# Patient Record
Sex: Female | Born: 1985 | Race: Black or African American | Hispanic: No | Marital: Single | Smoking: Never smoker
Health system: Southern US, Community
[De-identification: ages and names within clinical notes are randomized; demographics above are authoritative.]

## PROBLEM LIST (undated history)

## (undated) ENCOUNTER — Inpatient Hospital Stay: Payer: Self-pay

## (undated) DIAGNOSIS — F419 Anxiety disorder, unspecified: Secondary | ICD-10-CM

## (undated) DIAGNOSIS — F32A Depression, unspecified: Secondary | ICD-10-CM

## (undated) DIAGNOSIS — Z853 Personal history of malignant neoplasm of breast: Secondary | ICD-10-CM

## (undated) DIAGNOSIS — O039 Complete or unspecified spontaneous abortion without complication: Secondary | ICD-10-CM

## (undated) DIAGNOSIS — F4310 Post-traumatic stress disorder, unspecified: Secondary | ICD-10-CM

## (undated) DIAGNOSIS — F319 Bipolar disorder, unspecified: Secondary | ICD-10-CM

## (undated) HISTORY — DX: Anxiety disorder, unspecified: F41.9

## (undated) HISTORY — DX: Post-traumatic stress disorder, unspecified: F43.10

## (undated) HISTORY — PX: OTHER SURGICAL HISTORY: SHX169

## (undated) HISTORY — DX: Depression, unspecified: F32.A

## (undated) HISTORY — DX: Personal history of malignant neoplasm of breast: Z85.3

---
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
## 2006-06-12 ENCOUNTER — Observation Stay: Payer: Self-pay | Admitting: Certified Nurse Midwife

## 2006-07-02 ENCOUNTER — Observation Stay: Payer: Self-pay

## 2006-07-22 ENCOUNTER — Observation Stay: Payer: Self-pay | Admitting: Obstetrics and Gynecology

## 2006-08-13 ENCOUNTER — Observation Stay: Payer: Self-pay | Admitting: Certified Nurse Midwife

## 2006-08-16 ENCOUNTER — Inpatient Hospital Stay: Payer: Self-pay | Admitting: Obstetrics and Gynecology

## 2007-03-16 ENCOUNTER — Emergency Department: Payer: Self-pay | Admitting: Emergency Medicine

## 2007-10-20 ENCOUNTER — Emergency Department: Payer: Self-pay | Admitting: Emergency Medicine

## 2008-09-16 ENCOUNTER — Emergency Department: Payer: Self-pay | Admitting: Emergency Medicine

## 2008-12-22 ENCOUNTER — Emergency Department: Payer: Self-pay | Admitting: Unknown Physician Specialty

## 2009-06-14 ENCOUNTER — Emergency Department: Payer: Self-pay | Admitting: Emergency Medicine

## 2009-09-01 ENCOUNTER — Ambulatory Visit: Payer: Self-pay | Admitting: Advanced Practice Midwife

## 2009-09-07 ENCOUNTER — Encounter: Payer: Self-pay | Admitting: Obstetrics and Gynecology

## 2009-10-08 ENCOUNTER — Observation Stay: Payer: Self-pay | Admitting: Obstetrics and Gynecology

## 2009-10-19 ENCOUNTER — Observation Stay: Payer: Self-pay | Admitting: Obstetrics and Gynecology

## 2009-11-11 ENCOUNTER — Observation Stay: Payer: Self-pay

## 2009-11-20 ENCOUNTER — Observation Stay: Payer: Self-pay

## 2010-12-14 IMAGING — US US OB DETAIL+14 WK - NRPT [facility]
1 series · 14 of 28 positions shown · non-contrast
Comparison: none

[Series 1: us ob detail+14 wk - nrpt [facility] · 14 of 89 slices shown]
[im 4/89]
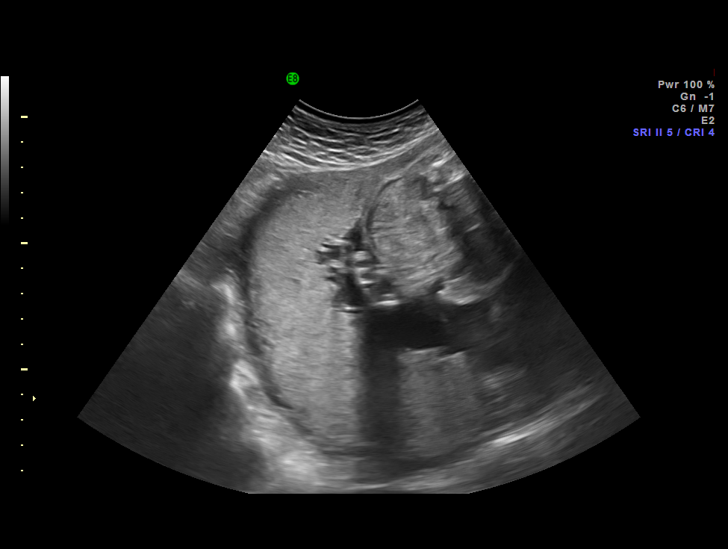
[im 10/89]
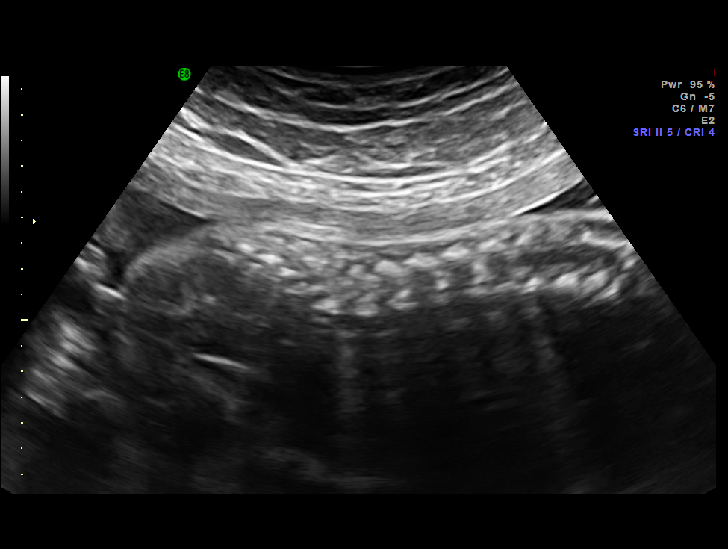
[im 17/89]
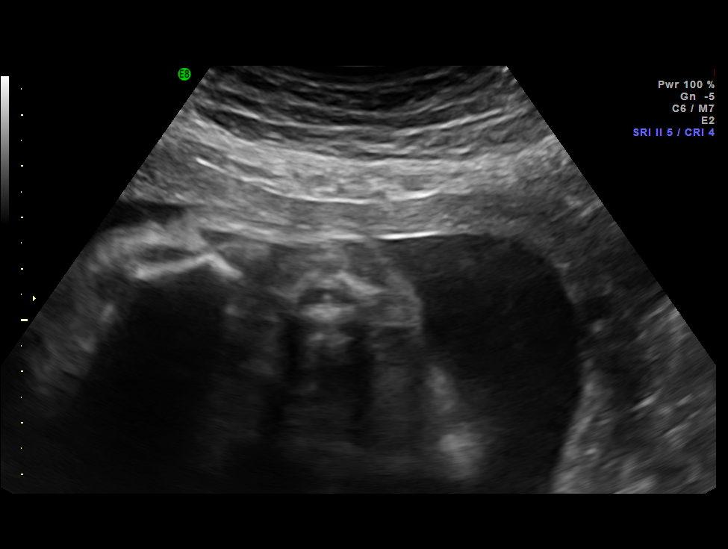
[im 23/89]
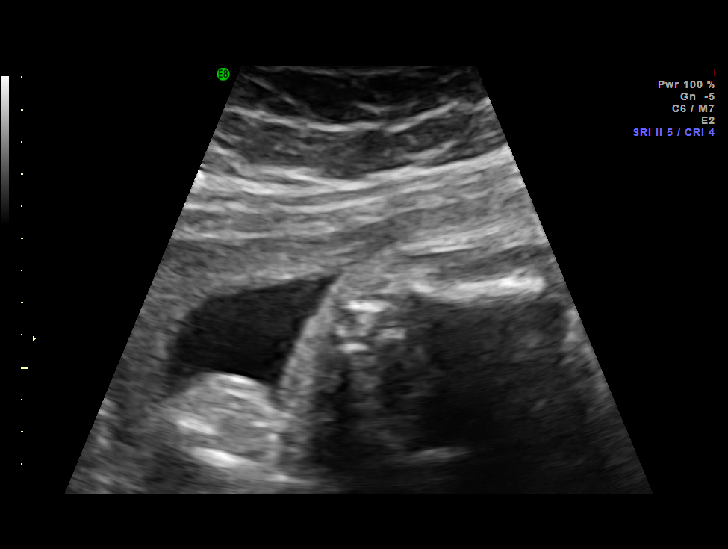
[im 30/89]
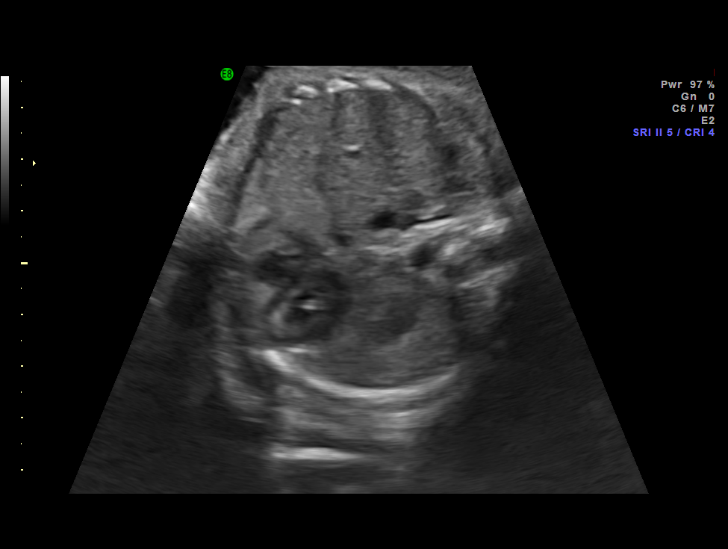
[im 36/89]
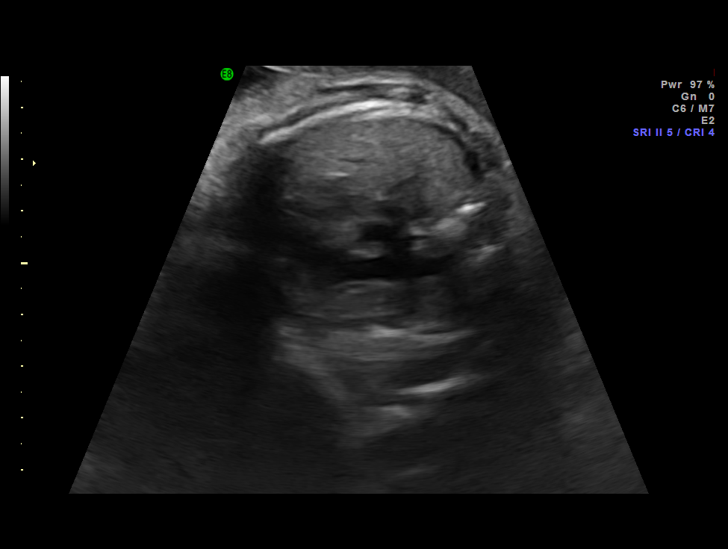
[im 43/89]
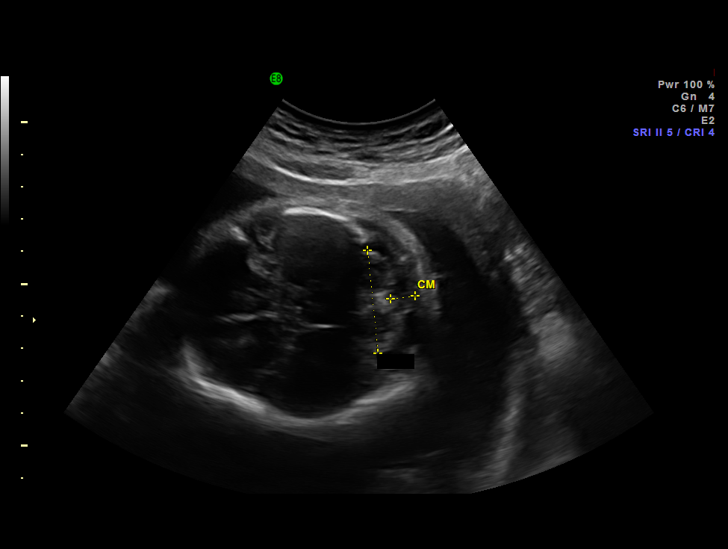
[im 49/89]
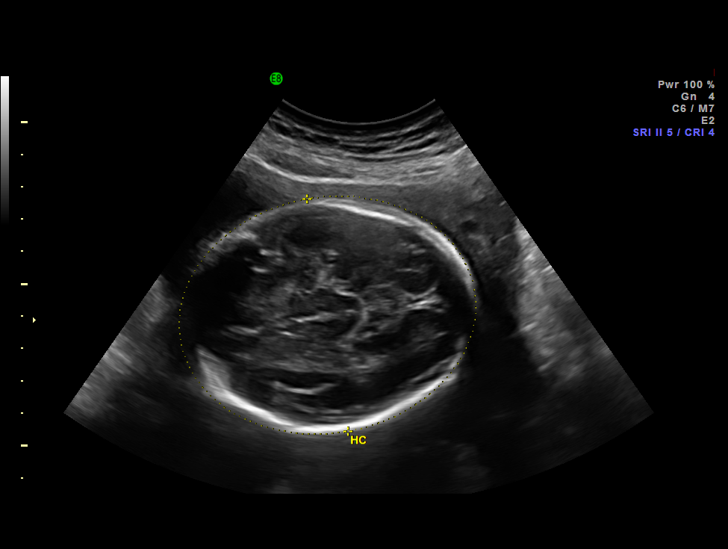
[im 56/89]
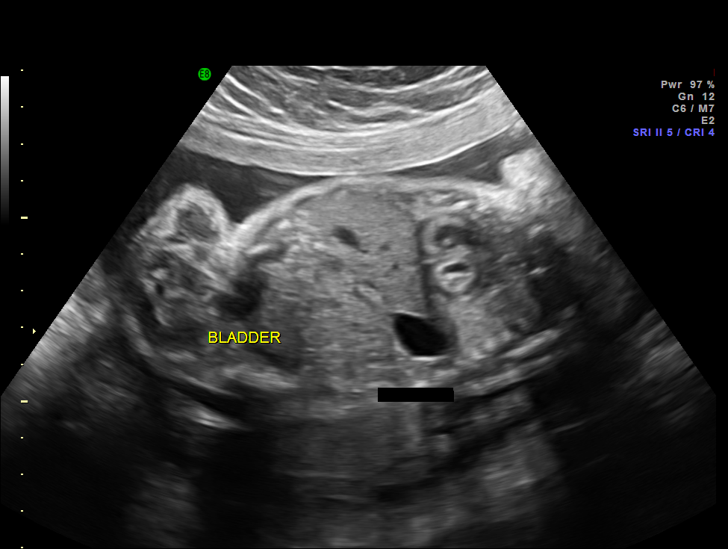
[im 62/89]
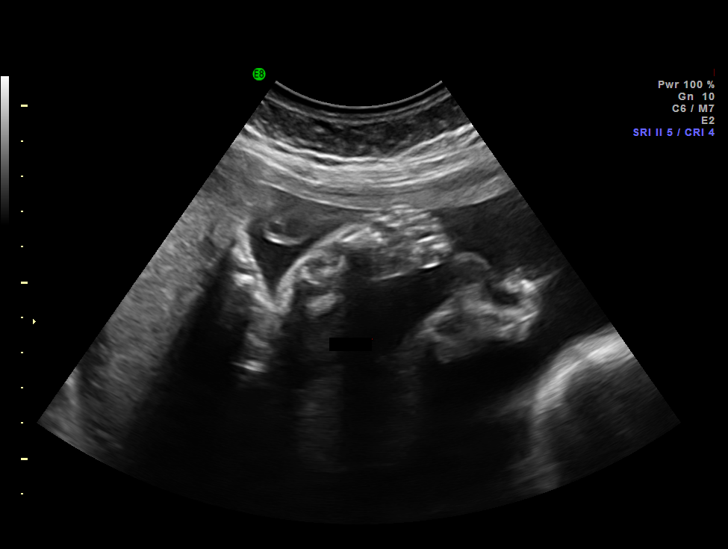
[im 69/89]
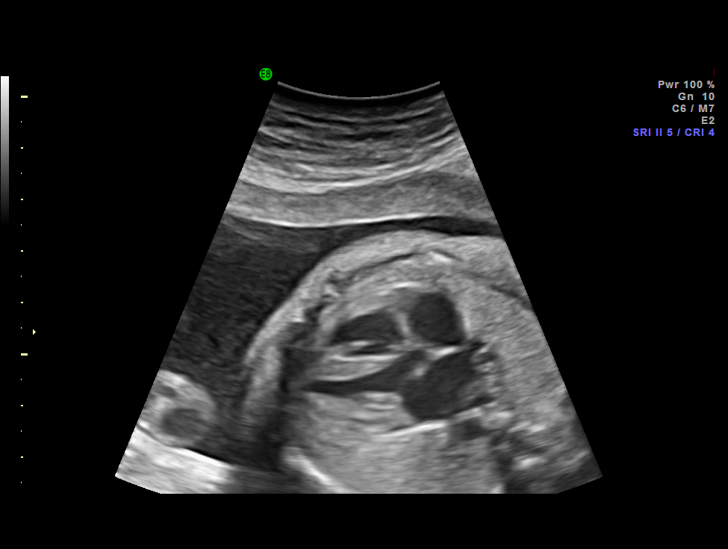
[im 75/89]
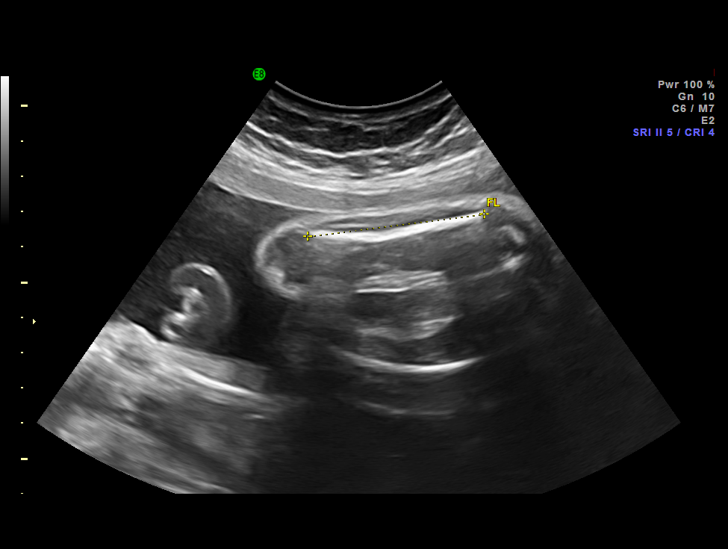
[im 82/89]
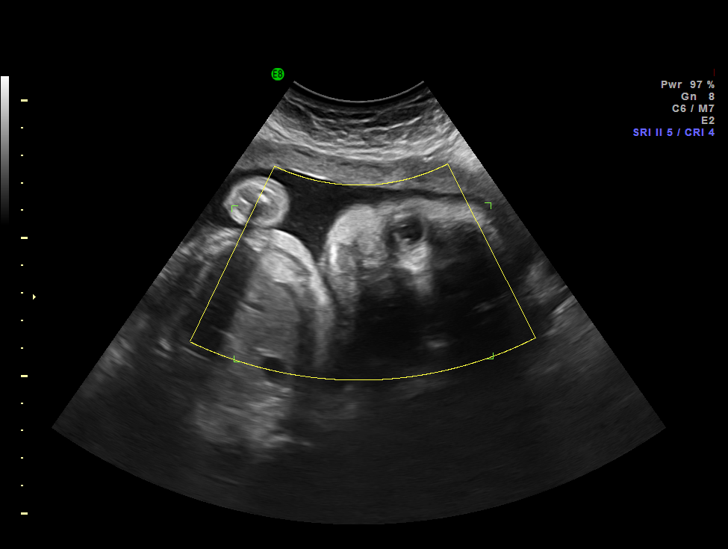
[im 89/89]
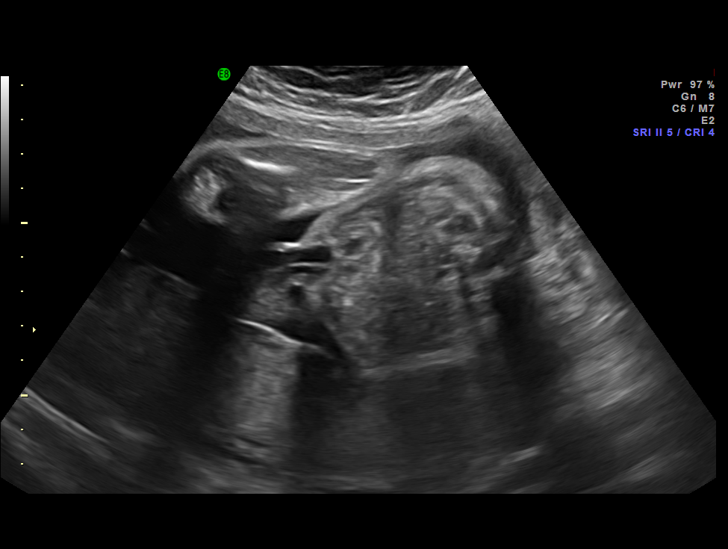

[14 of 28 positions shown; findings below may reference images not displayed]

IMAGES IMPORTED FROM THE [employer] WORKFLOW SYSTEM
NO DICTATION FOR STUDY

## 2011-03-20 ENCOUNTER — Emergency Department: Payer: Self-pay | Admitting: Emergency Medicine

## 2011-05-13 ENCOUNTER — Emergency Department: Payer: Self-pay | Admitting: Emergency Medicine

## 2011-07-19 ENCOUNTER — Emergency Department: Payer: Self-pay | Admitting: Emergency Medicine

## 2012-04-23 ENCOUNTER — Emergency Department: Payer: Self-pay | Admitting: Emergency Medicine

## 2012-04-23 LAB — URINALYSIS, COMPLETE
Bilirubin,UR: NEGATIVE
Blood: NEGATIVE
Glucose,UR: NEGATIVE mg/dL (ref 0–75)
Nitrite: POSITIVE
Ph: 6 (ref 4.5–8.0)
Protein: NEGATIVE
RBC,UR: 5 /HPF (ref 0–5)
Specific Gravity: 1.018 (ref 1.003–1.030)
Squamous Epithelial: 12
WBC UR: 65 /HPF (ref 0–5)

## 2012-04-23 LAB — COMPREHENSIVE METABOLIC PANEL
Albumin: 4.2 g/dL (ref 3.4–5.0)
Alkaline Phosphatase: 100 U/L (ref 50–136)
Anion Gap: 6 — ABNORMAL LOW (ref 7–16)
BUN: 7 mg/dL (ref 7–18)
Bilirubin,Total: 0.7 mg/dL (ref 0.2–1.0)
Calcium, Total: 9.1 mg/dL (ref 8.5–10.1)
Chloride: 106 mmol/L (ref 98–107)
Co2: 27 mmol/L (ref 21–32)
Creatinine: 0.58 mg/dL — ABNORMAL LOW (ref 0.60–1.30)
EGFR (African American): >60
EGFR (Non-African Amer.): >60
Glucose: 79 mg/dL (ref 65–99)
Osmolality: 274 (ref 275–301)
Potassium: 4.1 mmol/L (ref 3.5–5.1)
SGOT(AST): 26 U/L (ref 15–37)
SGPT (ALT): 16 U/L (ref 12–78)
Sodium: 139 mmol/L (ref 136–145)
Total Protein: 8.8 g/dL — ABNORMAL HIGH (ref 6.4–8.2)

## 2012-04-23 LAB — CBC
HCT: 37.4 % (ref 35.0–47.0)
HGB: 12.8 g/dL (ref 12.0–16.0)
MCH: 28.7 pg (ref 26.0–34.0)
MCHC: 34.3 g/dL (ref 32.0–36.0)
MCV: 84 fL (ref 80–100)
Platelet: 336 10*3/uL (ref 150–440)
RBC: 4.47 10*6/uL (ref 3.80–5.20)
RDW: 16.1 % — ABNORMAL HIGH (ref 11.5–14.5)
WBC: 7 10*3/uL (ref 3.6–11.0)

## 2012-04-23 LAB — PREGNANCY, URINE: Pregnancy Test, Urine: NEGATIVE m[IU]/mL

## 2013-04-28 ENCOUNTER — Emergency Department: Payer: Self-pay | Admitting: Emergency Medicine

## 2013-04-28 LAB — COMPREHENSIVE METABOLIC PANEL
Albumin: 3.9 g/dL (ref 3.4–5.0)
Alkaline Phosphatase: 97 U/L (ref 50–136)
Anion Gap: 4 — ABNORMAL LOW (ref 7–16)
BUN: 4 mg/dL — ABNORMAL LOW (ref 7–18)
Bilirubin,Total: 0.4 mg/dL (ref 0.2–1.0)
Calcium, Total: 9 mg/dL (ref 8.5–10.1)
Chloride: 105 mmol/L (ref 98–107)
Co2: 29 mmol/L (ref 21–32)
Creatinine: 0.66 mg/dL (ref 0.60–1.30)
EGFR (African American): >60
EGFR (Non-African Amer.): >60
Glucose: 103 mg/dL — ABNORMAL HIGH (ref 65–99)
Osmolality: 273 (ref 275–301)
Potassium: 3.4 mmol/L — ABNORMAL LOW (ref 3.5–5.1)
SGOT(AST): 21 U/L (ref 15–37)
SGPT (ALT): 16 U/L (ref 12–78)
Sodium: 138 mmol/L (ref 136–145)
Total Protein: 7.9 g/dL (ref 6.4–8.2)

## 2013-04-28 LAB — URINALYSIS, COMPLETE
Bilirubin,UR: NEGATIVE
Blood: NEGATIVE
Glucose,UR: NEGATIVE mg/dL (ref 0–75)
Ketone: NEGATIVE
Nitrite: POSITIVE
Ph: 6 (ref 4.5–8.0)
Protein: NEGATIVE
RBC,UR: 5 /HPF (ref 0–5)
Specific Gravity: 1.014 (ref 1.003–1.030)
Squamous Epithelial: 8
WBC UR: 59 /HPF (ref 0–5)

## 2013-04-28 LAB — CBC
HCT: 35.2 % (ref 35.0–47.0)
HGB: 12 g/dL (ref 12.0–16.0)
MCH: 29 pg (ref 26.0–34.0)
MCHC: 34.2 g/dL (ref 32.0–36.0)
MCV: 85 fL (ref 80–100)
Platelet: 302 10*3/uL (ref 150–440)
RBC: 4.15 10*6/uL (ref 3.80–5.20)
RDW: 14.1 % (ref 11.5–14.5)
WBC: 6.6 10*3/uL (ref 3.6–11.0)

## 2013-04-28 LAB — WET PREP, GENITAL

## 2013-04-28 LAB — GC/CHLAMYDIA PROBE AMP

## 2013-08-13 ENCOUNTER — Emergency Department: Payer: Self-pay | Admitting: Emergency Medicine

## 2014-07-09 ENCOUNTER — Emergency Department: Payer: Self-pay | Admitting: Emergency Medicine

## 2014-12-08 ENCOUNTER — Emergency Department
Admit: 2014-12-08 | Disposition: A | Discharge status: Home / Self Care | Payer: Self-pay | Admitting: Emergency Medicine

## 2014-12-08 LAB — URINALYSIS, COMPLETE
Bilirubin,UR: NEGATIVE
Glucose,UR: NEGATIVE mg/dL (ref 0–75)
Ketone: NEGATIVE
Nitrite: NEGATIVE
Ph: 6 (ref 4.5–8.0)
Protein: NEGATIVE
Specific Gravity: 1.013 (ref 1.003–1.030)

## 2014-12-08 LAB — CBC WITH DIFFERENTIAL/PLATELET
Basophil #: 0 10*3/uL (ref 0.0–0.1)
Basophil %: 0.3 %
Eosinophil #: 0.1 10*3/uL (ref 0.0–0.7)
Eosinophil %: 1 %
HCT: 37.1 % (ref 35.0–47.0)
HGB: 12.6 g/dL (ref 12.0–16.0)
Lymphocyte #: 2.4 10*3/uL (ref 1.0–3.6)
Lymphocyte %: 21.8 %
MCH: 29.9 pg (ref 26.0–34.0)
MCHC: 33.9 g/dL (ref 32.0–36.0)
MCV: 88 fL (ref 80–100)
Monocyte #: 0.7 x10 3/mm (ref 0.2–0.9)
Monocyte %: 6.4 %
Neutrophil #: 7.6 10*3/uL — ABNORMAL HIGH (ref 1.4–6.5)
Neutrophil %: 70.5 %
Platelet: 272 10*3/uL (ref 150–440)
RBC: 4.21 10*6/uL (ref 3.80–5.20)
RDW: 14.8 % — ABNORMAL HIGH (ref 11.5–14.5)
WBC: 10.8 10*3/uL (ref 3.6–11.0)

## 2014-12-08 LAB — COMPREHENSIVE METABOLIC PANEL
Albumin: 3.7 g/dL
Alkaline Phosphatase: 87 U/L
Anion Gap: 7 (ref 7–16)
BUN: <5 mg/dL
Bilirubin,Total: 0.5 mg/dL
Calcium, Total: 9 mg/dL
Chloride: 104 mmol/L
Co2: 23 mmol/L
Creatinine: 0.46 mg/dL
EGFR (African American): >60
EGFR (Non-African Amer.): >60
Glucose: 107 mg/dL — ABNORMAL HIGH
Potassium: 3.5 mmol/L
SGOT(AST): 28 U/L
SGPT (ALT): 22 U/L
Sodium: 134 mmol/L — ABNORMAL LOW
Total Protein: 7.8 g/dL

## 2015-02-28 ENCOUNTER — Observation Stay
Admission: EM | Admit: 2015-02-28 | Discharge: 2015-02-28 | Disposition: A | Discharge status: Home / Self Care | Payer: Medicaid Other | Attending: Obstetrics and Gynecology | Admitting: Obstetrics and Gynecology

## 2015-02-28 ENCOUNTER — Encounter: Payer: Self-pay | Admitting: *Deleted

## 2015-02-28 NOTE — Discharge Instructions (Signed)
[facility] discharge instructions and labor/pregnancy precautions given to patient with no questions or concerns.   Justo Hengel F  

## 2015-08-30 DIAGNOSIS — O039 Complete or unspecified spontaneous abortion without complication: Secondary | ICD-10-CM

## 2015-08-30 HISTORY — DX: Complete or unspecified spontaneous abortion without complication: O03.9

## 2016-01-03 ENCOUNTER — Encounter (HOSPITAL_COMMUNITY): Payer: Self-pay | Admitting: *Deleted

## 2016-01-03 ENCOUNTER — Emergency Department
Admission: EM | Admit: 2016-01-03 | Discharge: 2016-01-04 | Disposition: A | Discharge status: Home / Self Care | Payer: Medicaid Other | Attending: Emergency Medicine | Admitting: Emergency Medicine

## 2016-01-03 ENCOUNTER — Emergency Department: Payer: Medicaid Other

## 2016-01-03 DIAGNOSIS — Y929 Unspecified place or not applicable: Secondary | ICD-10-CM | POA: Diagnosis not present

## 2016-01-03 DIAGNOSIS — Y939 Activity, unspecified: Secondary | ICD-10-CM | POA: Insufficient documentation

## 2016-01-03 DIAGNOSIS — S9031XA Contusion of right foot, initial encounter: Secondary | ICD-10-CM

## 2016-01-03 DIAGNOSIS — Y999 Unspecified external cause status: Secondary | ICD-10-CM | POA: Insufficient documentation

## 2016-01-03 DIAGNOSIS — W228XXA Striking against or struck by other objects, initial encounter: Secondary | ICD-10-CM | POA: Insufficient documentation

## 2016-01-03 DIAGNOSIS — Z3A08 8 weeks gestation of pregnancy: Secondary | ICD-10-CM | POA: Insufficient documentation

## 2016-01-03 DIAGNOSIS — O9A211 Injury, poisoning and certain other consequences of external causes complicating pregnancy, first trimester: Secondary | ICD-10-CM | POA: Diagnosis present

## 2016-01-03 MED ORDER — OXYCODONE-ACETAMINOPHEN 5-325 MG PO TABS
ORAL_TABLET | ORAL | Status: AC
  Administered 2016-01-03: 1 via ORAL
  Filled 2016-01-03: qty 1

## 2016-01-03 MED ORDER — OXYCODONE-ACETAMINOPHEN 5-325 MG PO TABS
1.0000 | ORAL_TABLET | ORAL | Status: DC | PRN
  Administered 2016-01-03: 1 via ORAL

## 2016-01-03 NOTE — ED Notes (Addendum)
Patient states that she accidentally kicked a tree stump and having pain to right foot. Patient reports that she is [redacted] weeks pregnant.

## 2016-01-04 MED ORDER — ACETAMINOPHEN 500 MG PO TABS
1000.0000 mg | ORAL_TABLET | Freq: Once | ORAL | Status: AC
  Administered 2016-01-04: 1000 mg via ORAL
  Filled 2016-01-04: qty 2

## 2016-01-04 NOTE — ED Notes (Signed)
Pt discharged to home.  Family member driving.  Discharge instructions reviewed.  Verbalized understanding.  No questions or concerns at this time.  Teach back verified.  Pt in NAD.  No items left in ED.   

## 2016-01-04 NOTE — Discharge Instructions (Signed)
1. You may take Tylenol as needed for pain. 2. Wear podiatric shoe and use crutches as needed. 3. Apply ice over the affected area several times daily. 4. Return to the ER for worsening symptoms, persistent vomiting, difficulty breathing or other concerns.  Foot Contusion A foot contusion is a deep bruise to the foot. Contusions are the result of an injury that caused bleeding under the skin. The contusion may turn blue, purple, or yellow. Minor injuries will give you a painless contusion, but more severe contusions may stay painful and swollen for a few weeks. CAUSES  A foot contusion comes from a direct blow to that area, such as a heavy object falling on the foot. SYMPTOMS   Swelling of the foot.  Discoloration of the foot.  Tenderness or soreness of the foot. DIAGNOSIS  You will have a physical exam and will be asked about your history. You may need an X-ray of your foot to look for a broken bone (fracture).  TREATMENT  An elastic wrap may be recommended to support your foot. Resting, elevating, and applying cold compresses to your foot are often the best treatments for a foot contusion. Over-the-counter medicines may also be recommended for pain control. HOME CARE INSTRUCTIONS   Put ice on the injured area.  Put ice in a plastic bag.  Place a towel between your skin and the bag.  Leave the ice on for 15-20 minutes, 03-04 times a day.  Only take over-the-counter or prescription medicines for pain, discomfort, or fever as directed by your caregiver.  If told, use an elastic wrap as directed. This can help reduce swelling. You may remove the wrap for sleeping, showering, and bathing. If your toes become numb, cold, or blue, take the wrap off and reapply it more loosely.  Elevate your foot with pillows to reduce swelling.  Try to avoid standing or walking while the foot is painful. Do not resume use until instructed by your caregiver. Then, begin use gradually. If pain develops,  decrease use. Gradually increase activities that do not cause discomfort until you have normal use of your foot.  See your caregiver as directed. It is very important to keep all follow-up appointments in order to avoid any lasting problems with your foot, including long-term (chronic) pain. SEEK IMMEDIATE MEDICAL CARE IF:   You have increased redness, swelling, or pain in your foot.  Your swelling or pain is not relieved with medicines.  You have loss of feeling in your foot or are unable to move your toes.  Your foot turns cold or blue.  You have pain when you move your toes.  Your foot becomes warm to the touch.  Your contusion does not improve in 2 days. MAKE SURE YOU:   Understand these instructions.  Will watch your condition.  Will get help right away if you are not doing well or get worse.   This information is not intended to replace advice given to you by your health care provider. Make sure you discuss any questions you have with your health care provider.   Document Released: 06/06/2006 Document Revised: 02/14/2012 Document Reviewed: 04/21/2015 Elsevier Interactive Patient Education 2016 Elsevier Inc[employer]  Cryotherapy Cryotherapy is when you put ice on your injury. Ice helps lessen pain and puffiness (swelling) after an injury. Ice works the best when you start using it in the first 24 to 48 hours after an injury. HOME CARE  Put a dry or damp towel between the ice pack and your  skin.  You may press gently on the ice pack.  Leave the ice on for no more than 10 to 20 minutes at a time.  Check your skin after 5 minutes to make sure your skin is okay.  Rest at least 20 minutes between ice pack uses.  Stop using ice when your skin loses feeling (numbness).  Do not use ice on someone who cannot tell you when it hurts. This includes small children and people with memory problems (dementia). GET HELP RIGHT AWAY IF:  You have white spots on your skin.  Your skin  turns blue or pale.  Your skin feels waxy or hard.  Your puffiness gets worse. MAKE SURE YOU:   Understand these instructions.  Will watch your condition.  Will get help right away if you are not doing well or get worse.   This information is not intended to replace advice given to you by your health care provider. Make sure you discuss any questions you have with your health care provider.   Document Released: 02/01/2008 Document Revised: 11/07/2011 Document Reviewed: 04/07/2011 Elsevier Interactive Patient Education [employer] Inc[employer]

## 2016-01-04 NOTE — ED Provider Notes (Signed)
[facility] Emergency Department Provider Note   ____________________________________________  Time seen: Approximately 12:45 AM  I have reviewed the triage vital signs and the nursing notes.   HISTORY  Chief Complaint Foot Pain    HPI Elizabeth Zimmerman is a 30 y.o. female who presents to the ED from home with a chief complaint of right foot pain. Patient reports that she accidentally kicked a tree stump with her right foot; she was wearing sneakers at the time. Denies falling, head injury, LOC or neck pain. Patient reports she is [redacted] weeks pregnant. Denies abdominal pain, vaginal bleeding or discharge. States she was able to bear weight but it was painful afterwards. Incident occurred approximately 5 PM. He makes her pain better. Movement and bearing weight make her pain worse.   History reviewed. No pertinent past medical history.  Patient Active Problem List   Diagnosis Date Noted  . Labor and delivery, indication for care 02/28/2015    Past Surgical History  Procedure Laterality Date  . Cesarean section      Current Outpatient Rx  Name  Route  Sig  Dispense  Refill  . Prenatal Vit-Fe Fumarate-FA (PRENATAL MULTIVITAMIN) TABS tablet   Oral   Take 1 tablet by mouth daily at 12 noon.           Allergies Review of patient's allergies indicates no known allergies.  No family history on file.  Social History Social History  Substance Use Topics  . Smoking status: Never Smoker   . Smokeless tobacco: Never Used  . Alcohol Use: No    Review of Systems  Constitutional: No fever/chills. Eyes: No visual changes. ENT: No sore throat. Cardiovascular: Denies chest pain. Respiratory: Denies shortness of breath. Gastrointestinal: No abdominal pain.  No nausea, no vomiting.  No diarrhea.  No constipation. Genitourinary: Negative for dysuria. Musculoskeletal: Positive for right foot pain. Negative for back pain. Skin: Negative for  rash. Neurological: Negative for headaches, focal weakness or numbness.  10-point ROS otherwise negative.  ____________________________________________   PHYSICAL EXAM:  VITAL SIGNS: ED Triage Vitals  Enc Vitals Group     BP 01/03/16 2305 125/88 mmHg     Pulse Rate 01/03/16 2305 98     Resp 01/03/16 2305 18     Temp 01/03/16 2305 97.6 F (36.4 C)     Temp src --      SpO2 01/03/16 2305 98 %     Weight 01/03/16 2305 208 lb (94.348 kg)     Height 01/03/16 2305  (1.575 m)     Head Cir --      Peak Flow --      Pain Score 01/03/16 2305 10     Pain Loc --      Pain Edu? --      Excl. in GC? --     Constitutional: Asleep, easily awakened for exam. Alert and oriented. Well appearing and in no acute distress. Eyes: Conjunctivae are normal. PERRL. EOMI. Head: Atraumatic. Nose: No congestion/rhinnorhea. Mouth/Throat: Mucous membranes are moist.  Oropharynx non-erythematous. Neck: No stridor.  No cervical spine tenderness to palpation. Cardiovascular: Normal rate, regular rhythm. Grossly normal heart sounds.  Good peripheral circulation. Respiratory: Normal respiratory effort.  No retractions. Lungs CTAB. Gastrointestinal: Soft and nontender. No distention. No abdominal bruits. No CVA tenderness. Musculoskeletal: Right dorsal foot and sole mildly tender to palpation. No ecchymosis, swelling or deformity. No edema.  Full range of motion movement with mild pain. No joint effusions. 2+  distal pulses. Neurologic:  Normal speech and language. No gross focal neurologic deficits are appreciated.  Skin:  Skin is warm, dry and intact. No rash noted. Psychiatric: Mood and affect are normal. Speech and behavior are normal.  ____________________________________________   LABS (all labs ordered are listed, but only abnormal results are displayed)  Labs Reviewed - No data to  display ____________________________________________  EKG  None ____________________________________________  RADIOLOGY  Right foot complete (viewed by me, interpreted per Dr. Clovis RileyMitchell): Negative. ____________________________________________   PROCEDURES  Procedure(s) performed: None  Critical Care performed: No  ____________________________________________   INITIAL IMPRESSION / ASSESSMENT AND PLAN / ED COURSE  Pertinent labs & imaging results that were available during my care of the patient were reviewed by me and considered in my medical decision making (see chart for details).  30 year old female approximately [redacted] weeks pregnant with right foot contusion. Will place in podiatric shoe and crutches, administer Tylenol and follow-up with her PCP as needed. Strict return precautions given. Patient verbalizes understanding and agrees with plan of care. ____________________________________________   FINAL CLINICAL IMPRESSION(S) / ED DIAGNOSES  Final diagnoses:  Foot contusion, right, initial encounter      NEW MEDICATIONS STARTED DURING THIS VISIT:  New Prescriptions   No medications on file     Note:  This document was prepared using Dragon voice recognition software and may include unintentional dictation errors.    Irean HongJade J Sung, MD 01/04/16 [phone]

## 2016-01-20 ENCOUNTER — Emergency Department: Payer: Medicaid Other

## 2016-01-20 ENCOUNTER — Encounter: Payer: Self-pay | Admitting: Emergency Medicine

## 2016-01-20 ENCOUNTER — Emergency Department
Admission: EM | Admit: 2016-01-20 | Discharge: 2016-01-20 | Disposition: A | Discharge status: Home / Self Care | Payer: Medicaid Other | Attending: Emergency Medicine | Admitting: Emergency Medicine

## 2016-01-20 DIAGNOSIS — Z3490 Encounter for supervision of normal pregnancy, unspecified, unspecified trimester: Secondary | ICD-10-CM

## 2016-01-20 DIAGNOSIS — O09621 Supervision of young multigravida, first trimester: Secondary | ICD-10-CM | POA: Insufficient documentation

## 2016-01-20 DIAGNOSIS — Z3A11 11 weeks gestation of pregnancy: Secondary | ICD-10-CM | POA: Diagnosis not present

## 2016-01-20 DIAGNOSIS — R1030 Lower abdominal pain, unspecified: Secondary | ICD-10-CM | POA: Diagnosis present

## 2016-01-20 LAB — URINALYSIS COMPLETE WITH MICROSCOPIC (ARMC ONLY)
Bilirubin Urine: NEGATIVE
Glucose, UA: NEGATIVE mg/dL
Leukocytes, UA: NEGATIVE
Nitrite: NEGATIVE
Protein, ur: NEGATIVE mg/dL
Specific Gravity, Urine: 1.019 (ref 1.005–1.030)
pH: 5 (ref 5.0–8.0)

## 2016-01-20 LAB — COMPREHENSIVE METABOLIC PANEL
ALT: 18 U/L (ref 14–54)
AST: 25 U/L (ref 15–41)
Albumin: 4.4 g/dL (ref 3.5–5.0)
Alkaline Phosphatase: 64 U/L (ref 38–126)
Anion gap: 8 (ref 5–15)
BUN: 6 mg/dL (ref 6–20)
CO2: 23 mmol/L (ref 22–32)
Calcium: 8.9 mg/dL (ref 8.9–10.3)
Chloride: 104 mmol/L (ref 101–111)
Creatinine, Ser: 0.55 mg/dL (ref 0.44–1.00)
GFR calc Af Amer: >60 mL/min (ref >60)
GFR calc non Af Amer: >60 mL/min (ref >60)
Glucose, Bld: 79 mg/dL (ref 65–99)
Potassium: 3.4 mmol/L — ABNORMAL LOW (ref 3.5–5.1)
Sodium: 135 mmol/L (ref 135–145)
Total Bilirubin: 0.7 mg/dL (ref 0.3–1.2)
Total Protein: 7.9 g/dL (ref 6.5–8.1)

## 2016-01-20 LAB — CBC
HCT: 31.3 % — ABNORMAL LOW (ref 35.0–47.0)
Hemoglobin: 10.4 g/dL — ABNORMAL LOW (ref 12.0–16.0)
MCH: 26.6 pg (ref 26.0–34.0)
MCHC: 33.2 g/dL (ref 32.0–36.0)
MCV: 80.3 fL (ref 80.0–100.0)
Platelets: 299 10*3/uL (ref 150–440)
RBC: 3.9 MIL/uL (ref 3.80–5.20)
RDW: 17.8 % — ABNORMAL HIGH (ref 11.5–14.5)
WBC: 6 10*3/uL (ref 3.6–11.0)

## 2016-01-20 LAB — POCT PREGNANCY, URINE: Preg Test, Ur: POSITIVE — AB

## 2016-01-20 LAB — LIPASE, BLOOD: Lipase: 23 U/L (ref 11–51)

## 2016-01-20 LAB — HCG, QUANTITATIVE, PREGNANCY: hCG, Beta Chain, Quant, S: 76386 m[IU]/mL — ABNORMAL HIGH (ref <5)

## 2016-01-20 NOTE — Discharge Instructions (Signed)
First Trimester of Pregnancy The first trimester of pregnancy is from week 1 until the end of week 12 (months 1 through 3). A week after a sperm fertilizes an egg, the egg will implant on the wall of the uterus. This embryo will begin to develop into a baby. Genes from you and your partner are forming the baby. The female genes determine whether the baby is a boy or a girl. At 6-8 weeks, the eyes and face are formed, and the heartbeat can be seen on ultrasound. At the end of 12 weeks, all the baby's organs are formed.  Now that you are pregnant, you will want to do everything you can to have a healthy baby. Two of the most important things are to get good prenatal care and to follow your health care provider's instructions. Prenatal care is all the medical care you receive before the baby's birth. This care will help prevent, find, and treat any problems during the pregnancy and childbirth. BODY CHANGES Your body goes through many changes during pregnancy. The changes vary from woman to woman.   You may gain or lose a couple of pounds at first.  You may feel sick to your stomach (nauseous) and throw up (vomit). If the vomiting is uncontrollable, call your health care provider.  You may tire easily.  You may develop headaches that can be relieved by medicines approved by your health care provider.  You may urinate more often. Painful urination may mean you have a bladder infection.  You may develop heartburn as a result of your pregnancy.  You may develop constipation because certain hormones are causing the muscles that push waste through your intestines to slow down.  You may develop hemorrhoids or swollen, bulging veins (varicose veins).  Your breasts may begin to grow larger and become tender. Your nipples may stick out more, and the tissue that surrounds them (areola) may become darker.  Your gums may bleed and may be sensitive to brushing and flossing.  Dark spots or blotches (chloasma,  mask of pregnancy) may develop on your face. This will likely fade after the baby is born.  Your menstrual periods will stop.  You may have a loss of appetite.  You may develop cravings for certain kinds of food.  You may have changes in your emotions from day to day, such as being excited to be pregnant or being concerned that something may go wrong with the pregnancy and baby.  You may have more vivid and strange dreams.  You may have changes in your hair. These can include thickening of your hair, rapid growth, and changes in texture. Some women also have hair loss during or after pregnancy, or hair that feels dry or thin. Your hair will most likely return to normal after your baby is born. WHAT TO EXPECT AT YOUR PRENATAL VISITS During a routine prenatal visit:  You will be weighed to make sure you and the baby are growing normally.  Your blood pressure will be taken.  Your abdomen will be measured to track your baby's growth.  The fetal heartbeat will be listened to starting around week 10 or 12 of your pregnancy.  Test results from any previous visits will be discussed. Your health care provider may ask you:  How you are feeling.  If you are feeling the baby move.  If you have had any abnormal symptoms, such as leaking fluid, bleeding, severe headaches, or abdominal cramping.  If you are using any tobacco products,   including cigarettes, chewing tobacco, and electronic cigarettes.  If you have any questions. Other tests that may be performed during your first trimester include:  Blood tests to find your blood type and to check for the presence of any previous infections. They will also be used to check for low iron levels (anemia) and Rh antibodies. Later in the pregnancy, blood tests for diabetes will be done along with other tests if problems develop.  Urine tests to check for infections, diabetes, or protein in the urine.  An ultrasound to confirm the proper growth  and development of the baby.  An amniocentesis to check for possible genetic problems.  Fetal screens for spina bifida and Down syndrome.  You may need other tests to make sure you and the baby are doing well.  HIV (human immunodeficiency virus) testing. Routine prenatal testing includes screening for HIV, unless you choose not to have this test. HOME CARE INSTRUCTIONS  Medicines  Follow your health care provider's instructions regarding medicine use. Specific medicines may be either safe or unsafe to take during pregnancy.  Take your prenatal vitamins as directed.  If you develop constipation, try taking a stool softener if your health care provider approves. Diet  Eat regular, well-balanced meals. Choose a variety of foods, such as meat or vegetable-based protein, fish, milk and low-fat dairy products, vegetables, fruits, and whole grain breads and cereals. Your health care provider will help you determine the amount of weight gain that is right for you.  Avoid raw meat and uncooked cheese. These carry germs that can cause birth defects in the baby.  Eating four or five small meals rather than three large meals a day may help relieve nausea and vomiting. If you start to feel nauseous, eating a few soda crackers can be helpful. Drinking liquids between meals instead of during meals also seems to help nausea and vomiting.  If you develop constipation, eat more high-fiber foods, such as fresh vegetables or fruit and whole grains. Drink enough fluids to keep your urine clear or pale yellow. Activity and Exercise  Exercise only as directed by your health care provider. Exercising will help you:  Control your weight.  Stay in shape.  Be prepared for labor and delivery.  Experiencing pain or cramping in the lower abdomen or low back is a good sign that you should stop exercising. Check with your health care provider before continuing normal exercises.  Try to avoid standing for long  periods of time. Move your legs often if you must stand in one place for a long time.  Avoid heavy lifting.  Wear low-heeled shoes, and practice good posture.  You may continue to have sex unless your health care provider directs you otherwise. Relief of Pain or Discomfort  Wear a good support bra for breast tenderness.   Take warm sitz baths to soothe any pain or discomfort caused by hemorrhoids. Use hemorrhoid cream if your health care provider approves.   Rest with your legs elevated if you have leg cramps or low back pain.  If you develop varicose veins in your legs, wear support hose. Elevate your feet for 15 minutes, 3-4 times a day. Limit salt in your diet. Prenatal Care  Schedule your prenatal visits by the twelfth week of pregnancy. They are usually scheduled monthly at first, then more often in the last 2 months before delivery.  Write down your questions. Take them to your prenatal visits.  Keep all your prenatal visits as directed by your   health care provider. Safety  Wear your seat belt at all times when driving.  Make a list of emergency phone numbers, including numbers for family, friends, the hospital, and police and fire departments. General Tips  Ask your health care provider for a referral to a local prenatal education class. Begin classes no later than at the beginning of month 6 of your pregnancy.  Ask for help if you have counseling or nutritional needs during pregnancy. Your health care provider can offer advice or refer you to specialists for help with various needs.  Do not use hot tubs, steam rooms, or saunas.  Do not douche or use tampons or scented sanitary pads.  Do not cross your legs for long periods of time.  Avoid cat litter boxes and soil used by cats. These carry germs that can cause birth defects in the baby and possibly loss of the fetus by miscarriage or stillbirth.  Avoid all smoking, herbs, alcohol, and medicines not prescribed by  your health care provider. Chemicals in these affect the formation and growth of the baby.  Do not use any tobacco products, including cigarettes, chewing tobacco, and electronic cigarettes. If you need help quitting, ask your health care provider. You may receive counseling support and other resources to help you quit.  Schedule a dentist appointment. At home, brush your teeth with a soft toothbrush and be gentle when you floss. SEEK MEDICAL CARE IF:   You have dizziness.  You have mild pelvic cramps, pelvic pressure, or nagging pain in the abdominal area.  You have persistent nausea, vomiting, or diarrhea.  You have a bad smelling vaginal discharge.  You have pain with urination.  You notice increased swelling in your face, hands, legs, or ankles. SEEK IMMEDIATE MEDICAL CARE IF:   You have a fever.  You are leaking fluid from your vagina.  You have spotting or bleeding from your vagina.  You have severe abdominal cramping or pain.  You have rapid weight gain or loss.  You vomit blood or material that looks like coffee grounds.  You are exposed to German measles and have never had them.  You are exposed to fifth disease or chickenpox.  You develop a severe headache.  You have shortness of breath.  You have any kind of trauma, such as from a fall or a car accident.   This information is not intended to replace advice given to you by your health care provider. Make sure you discuss any questions you have with your health care provider.   Document Released: 08/09/2001 Document Revised: 09/05/2014 Document Reviewed: 06/25/2013 Elsevier Interactive Patient Education 2016 [employer] Inc.  

## 2016-01-20 NOTE — ED Provider Notes (Signed)
Time Seen: Approximately 1540  I have reviewed the triage notes  Chief Complaint: Vaginal Bleeding and Emesis During Pregnancy   History of Present Illness: Elizabeth Zimmerman is a 30 y.o. female *who presents with a history of being gravida 4 para 3 and approximately [redacted] weeks pregnant based on last menstrual period. She has been seen by the health department and they had discussed future ultrasound likely at 16 weeks. Patient states he was doing fine up until about 2 weeks ago and started having some spotty vaginal bleeding. She states today approximately 30 minutes prior to arrival she had a large amount of bleeding that seemed to be a heavy period with clots. She denies seeing any tissue. She denies any lightheadedness or near syncope. History reviewed. No pertinent past medical history.  Patient Active Problem List   Diagnosis Date Noted  . Labor and delivery, indication for care 02/28/2015    Past Surgical History  Procedure Laterality Date  . Cesarean section      Past Surgical History  Procedure Laterality Date  . Cesarean section      Current Outpatient Rx  Name  Route  Sig  Dispense  Refill  . Prenatal Vit-Fe Fumarate-FA (PRENATAL MULTIVITAMIN) TABS tablet   Oral   Take 1 tablet by mouth daily at 12 noon.           Allergies:  Review of patient's allergies indicates no known allergies.  Family History: No family history on file.  Social History: Social History  Substance Use Topics  . Smoking status: Never Smoker   . Smokeless tobacco: Never Used  . Alcohol Use: No     Review of Systems:   10 point review of systems was performed and was otherwise negative:  Constitutional: No fever Eyes: No visual disturbances ENT: No sore throat, ear pain Cardiac: No chest pain Respiratory: No shortness of breath, wheezing, or stridor Abdomen: Crampy middle lower abdominal pain. No nausea or vomiting  Endocrine: No weight loss, No night sweats Extremities: No  peripheral edema, cyanosis Skin: No rashes, easy bruising Neurologic: No focal weakness, trouble with speech or swollowing Urologic: No dysuria, Hematuria, or urinary frequency   Physical Exam:  ED Triage Vitals  Enc Vitals Group     BP 01/20/16 1242 112/68 mmHg     Pulse Rate 01/20/16 1242 73     Resp 01/20/16 1242 20     Temp 01/20/16 1242 98.5 F (36.9 C)     Temp Source 01/20/16 1242 Oral     SpO2 01/20/16 1242 99 %     Weight 01/20/16 1242 208 lb (94.348 kg)     Height 01/20/16 1242  (1.575 m)     Head Cir --      Peak Flow --      Pain Score 01/20/16 1242 4     Pain Loc --      Pain Edu? --      Excl. in GC? --     General: Awake , Alert , and Oriented times 3; GCS 15 Head: Normal cephalic , atraumatic Eyes: Pupils equal , round, reactive to light Nose/Throat: No nasal drainage, patent upper airway without erythema or exudate.  Neck: Supple, Full range of motion, No anterior adenopathy or palpable thyroid masses Lungs: Clear to ascultation without wheezes , rhonchi, or rales Heart: Regular rate, regular rhythm without murmurs , gallops , or rubs Abdomen: Soft, non tender without rebound, guarding , or rigidity; bowel sounds positive and  symmetric in all 4 quadrants. No organomegaly .        Extremities: 2 plus symmetric pulses. No edema, clubbing or cyanosis Neurologic: normal ambulation, Motor symmetric without deficits, sensory intact Skin: warm, dry, no rashes   Labs:   All laboratory work was reviewed including any pertinent negatives or positives listed below:  Labs Reviewed  HCG, QUANTITATIVE, PREGNANCY - Abnormal; Notable for the following:    hCG, Beta Chain, [place] LongestQuant, S [id number] (*)    All other components within normal limits  COMPREHENSIVE METABOLIC PANEL - Abnormal; Notable for the following:    Potassium 3.4 (*)    All other components within normal limits  CBC - Abnormal; Notable for the following:    Hemoglobin 10.4 (*)    HCT 31.3 (*)    RDW  17.8 (*)    All other components within normal limits  URINALYSIS COMPLETEWITH MICROSCOPIC ([facility] ONLY) - Abnormal; Notable for the following:    Color, Urine YELLOW (*)    APPearance HAZY (*)    Ketones, ur 2+ (*)    Hgb urine dipstick 3+ (*)    Bacteria, UA RARE (*)    Squamous Epithelial / LPF 6-30 (*)    All other components within normal limits  POCT PREGNANCY, URINE - Abnormal; Notable for the following:    Preg Test, Ur POSITIVE (*)    All other components within normal limits  LIPASE, BLOOD  POC URINE PREG, ED    Radiology:    CLINICAL DATA: Pregnant, vaginal bleeding, 11 weeks 1 day EGA by LMP  EXAM: OBSTETRIC <14 WK US AND TRANSVAGINAL OB US  TECHNIQUE: Both transabdominal and transvaginal ultrasound examinations were performed for complete evaluation of the gestation as well as the maternal uterus, adnexal regions, and pelvic cul-de-sac. Transvaginal technique was performed to assess early pregnancy.  COMPARISON: None for this gestation  FINDINGS: Intrauterine gestational sac: Present  Yolk sac: Not identified  Embryo: Present  Cardiac Activity: Present  Heart Rate: 163 bpm  CRL: 45.1 mm  11 w  2 d         US EDC: 08/08/2016  Subchorionic hemorrhage: Small subchorionic hemorrhage 12 x 20 x 18 mm  Maternal uterus/adnexae:  LEFT ovary normal size and morphology 3.2 x 1.8 x 3.5 cm.  RIGHT ovary not visualized on either transabdominal or endovaginal imaging question obscured by bowel.  No free pelvic fluid or adnexal masses.  IMPRESSION: Single live intrauterine gestation with crown-rump length corresponding to 11 weeks 2 days EGA.  Small subchronic hemorrhage as above.  Nonvisualization of RIGHT ovary.   Electronically Signed By: Ulyses SouthwardMark Boles M.D. On: 01/20/2016 16:37          US OB Comp Less 14 Wks (Final result) Result time: 01/20/16 16:37:07   Final result by Rad Results In Interface (01/20/16 16:37:07)    Narrative:   CLINICAL DATA: Pregnant, vaginal bleeding, 11 weeks 1 day EGA by LMP  EXAM: OBSTETRIC <14 WK US AND TRANSVAGINAL OB US  TECHNIQUE: Both transabdominal and transvaginal ultrasound examinations were performed for complete evaluation of the gestation as well as the maternal uterus, adnexal regions, and pelvic cul-de-sac. Transvaginal technique was performed to assess early pregnancy.  COMPARISON: None for this gestation  FINDINGS: Intrauterine gestational sac: Present  Yolk sac: Not identified  Embryo: Present  Cardiac Activity: Present  Heart Rate: 163 bpm  CRL: 45.1 mm  11 w  2 d         US EDC: 08/08/2016  Subchorionic  hemorrhage: Small subchorionic hemorrhage 12 x 20 x 18 mm  Maternal uterus/adnexae:  LEFT ovary normal size and morphology 3.2 x 1.8 x 3.5 cm.  RIGHT ovary not visualized on either transabdominal or endovaginal imaging question obscured by bowel.  No free pelvic fluid or adnexal masses.  IMPRESSION: Single live intrauterine gestation with crown-rump length corresponding to 11 weeks 2 days EGA.  Small subchronic hemorrhage as above.  Nonvisualization of RIGHT ovary.   Electronically Signed By: Ulyses Southward M.D. On: 01/20/2016 16:37            I personally reviewed the radiologic studies    ED Course:  Patient's stay here was uneventful and she had no other further significant bleeding. Her ultrasound shows what appears to be a normal functioning 34-week-old pregnancy. The patient does have a small area of subchorionic hemorrhage which may have been possibly recreated the vaginal bleeding today. Patient was advised to rest for the next 48 hours and try to avoid any heavy lifting.    Assessment:  Threatened spontaneous abortion First trimester pregnancy Subchorionic hemorrhage   Final Clinical Impression:   Final diagnoses:  Pregnancy at early stage     Plan:  Outpatient  management Patient was advised to return immediately if condition worsens. Patient was advised to follow up with their primary care physician or other specialized physicians involved in their outpatient care. The patient and/or family member/power of attorney had laboratory results reviewed at the bedside. All questions and concerns were addressed and appropriate discharge instructions were distributed by the nursing staff.             Jennye Moccasin, MD 01/20/16 [phone]

## 2016-01-20 NOTE — ED Notes (Signed)
Patient presents to the with vaginal bleeding x 2 weeks, patient states until today it has been spotting but 30 minutes prior to arrival, patient states she went to the bathroom and "gushed" blood.  Patient is complaining of lower abdominal cramping and nausea, vomiting.  Patient states she is [redacted] weeks pregnant at this time.  Patient is ambulatory to triage without obvious difficulty.

## 2016-02-27 ENCOUNTER — Encounter: Payer: Self-pay | Admitting: Emergency Medicine

## 2016-02-27 ENCOUNTER — Emergency Department: Payer: Medicaid Other

## 2016-02-27 ENCOUNTER — Emergency Department
Admission: EM | Admit: 2016-02-27 | Discharge: 2016-02-27 | Disposition: A | Discharge status: Home / Self Care | Payer: Medicaid Other | Attending: Emergency Medicine | Admitting: Emergency Medicine

## 2016-02-27 DIAGNOSIS — O209 Hemorrhage in early pregnancy, unspecified: Secondary | ICD-10-CM | POA: Diagnosis present

## 2016-02-27 DIAGNOSIS — Z3A16 16 weeks gestation of pregnancy: Secondary | ICD-10-CM | POA: Diagnosis not present

## 2016-02-27 DIAGNOSIS — O200 Threatened abortion: Secondary | ICD-10-CM | POA: Insufficient documentation

## 2016-02-27 LAB — CBC
HCT: 31.2 % — ABNORMAL LOW (ref 35.0–47.0)
Hemoglobin: 10.6 g/dL — ABNORMAL LOW (ref 12.0–16.0)
MCH: 28 pg (ref 26.0–34.0)
MCHC: 34.1 g/dL (ref 32.0–36.0)
MCV: 82 fL (ref 80.0–100.0)
Platelets: 284 10*3/uL (ref 150–440)
RBC: 3.8 MIL/uL (ref 3.80–5.20)
RDW: 18.5 % — ABNORMAL HIGH (ref 11.5–14.5)
WBC: 9.9 10*3/uL (ref 3.6–11.0)

## 2016-02-27 LAB — ABO/RH: ABO/RH(D): A POS

## 2016-02-27 LAB — HCG, QUANTITATIVE, PREGNANCY: hCG, Beta Chain, Quant, S: 24286 m[IU]/mL — ABNORMAL HIGH (ref <5)

## 2016-02-27 NOTE — Discharge Instructions (Signed)

## 2016-02-27 NOTE — ED Provider Notes (Signed)
[facility] Emergency Department Provider Note  ____________________________________________    I have reviewed the triage vital signs and the nursing notes.   HISTORY  Chief Complaint Vaginal Bleeding    HPI Elizabeth Zimmerman is a 30 y.o. female who is [redacted] weeks pregnant and presents with vaginal bleeding. Patient reports she has had bleeding frequently during this pregnancy but she reports this morning the bleeding seemed slightly worse and she was having cramping in her lower pelvis. She reports she is feeling better currently, [facility]. No nausea or vomiting no fevers or chills, no dysuria. She is a G4 P3 last delivered in October 2016     History reviewed. No pertinent past medical history.  Patient Active Problem List   Diagnosis Date Noted  . Labor and delivery, indication for care 02/28/2015    Past Surgical History  Procedure Laterality Date  . Cesarean section      Current Outpatient Rx  Name  Route  Sig  Dispense  Refill  . Prenatal Vit-Fe Fumarate-FA (PRENATAL MULTIVITAMIN) TABS tablet   Oral   Take 1 tablet by mouth daily at 12 noon.           Allergies Review of patient's allergies indicates no known allergies.  No family history on file.  Social History Social History  Substance Use Topics  . Smoking status: Never Smoker   . Smokeless tobacco: Never Used  . Alcohol Use: No    Review of Systems  Constitutional: Negative for fever. Eyes: Negative for redness ENT: Negative for sore throat Cardiovascular: Negative for chest pain Respiratory: Negative for shortness of breath. Gastrointestinal: Negative for abdominal pain Genitourinary: Negative for dysuria.Positive vaginal bleeding as above Musculoskeletal: Negative for back pain. Skin: Negative for rash. Neurological: Negative for focal weakness Psychiatric: no anxiety    ____________________________________________   PHYSICAL  EXAM:  VITAL SIGNS: ED Triage Vitals  Enc Vitals Group     BP 02/27/16 1150 130/69 mmHg     Pulse Rate 02/27/16 1150 89     Resp 02/27/16 1150 20     Temp 02/27/16 1150 98.6 F (37 C)     Temp Source 02/27/16 1150 Oral     SpO2 02/27/16 1150 99 %     Weight 02/27/16 1150 212 lb (96.163 kg)     Height 02/27/16 1150 5\' 2"  (1.575 m)     Head Cir --      Peak Flow --      Pain Score 02/27/16 1150 5     Pain Loc --      Pain Edu? --      Excl. in GC? --      Constitutional: Alert and oriented. Well appearing and in no distress. Pleasant and interactive Eyes: Conjunctivae are normal. No erythema or injection ENT   Head: Normocephalic and atraumatic.   Mouth/Throat: Mucous membranes are moist. Cardiovascular: Normal rate, regular rhythm. Normal and symmetric distal pulses are present in the upper extremities.  Respiratory: Normal respiratory effort without tachypnea nor retractions. Breath sounds are clear and equal bilaterally.  Gastrointestinal: Soft and non-tender in all quadrants. No distention. There is no CVA tenderness. Genitourinary: Blood clots in vaginal vault, cervix is closed Musculoskeletal: Nontender with normal range of motion in all extremities. No lower extremity tenderness nor edema. Neurologic:  Normal speech and language. No gross focal neurologic deficits are appreciated. Skin:  Skin is warm, dry and intact. No rash noted. Psychiatric: Mood and affect  are normal. Patient exhibits appropriate insight and judgment.  ____________________________________________    LABS (pertinent positives/negatives)  Labs Reviewed  HCG, QUANTITATIVE, PREGNANCY - Abnormal; Notable for the following:    hCG, Beta Chain, Quant, S [id number] (*)    All other components within normal limits  CBC - Abnormal; Notable for the following:    Hemoglobin 10.6 (*)    HCT 31.2 (*)    RDW 18.5 (*)    All other components within normal limits  ABO/RH     ____________________________________________   EKG  None  ____________________________________________    RADIOLOGY  Ultrasound shows IUP heart rate 158  ____________________________________________   PROCEDURES  Procedure(s) performed: none  Critical Care performed: none  ____________________________________________   INITIAL IMPRESSION / ASSESSMENT AND PLAN / ED COURSE  Pertinent labs & imaging results that were available during my care of the patient were reviewed by me and considered in my medical decision making (see chart for details).  Discussed case with Dr. Jean RosenthalJackson of OB/GYN. No interventions necessary at this time. I discussed with the patient that her symptoms could be consistent with an early miscarriage and that if her cramping or bleeding worsens she needs to return to the ED immediately. She is Rh+.  Vital signs stable, appropriate for discharge.  ____________________________________________   FINAL CLINICAL IMPRESSION(S) / ED DIAGNOSES  Final diagnoses:  Threatened miscarriage          Elizabeth Everyobert Jacaden Forbush, MD 02/27/16 1626

## 2016-02-27 NOTE — ED Notes (Signed)
Patient states that she has been "bleeding for weeks" and has been seen by several doctors. Patient states that today was worse than normal when she passed several large clots". Mucous membranes noted to be pink and moist

## 2016-02-27 NOTE — ED Notes (Signed)
Pt. Going home with friend 

## 2016-02-27 NOTE — ED Notes (Signed)
States passing blood clots vaginally today, is [redacted] weeks pregnant and has been having some bleeding for past 2 months however is now increased. Abdominal pain.

## 2017-04-14 ENCOUNTER — Encounter: Payer: Self-pay | Admitting: *Deleted

## 2017-04-14 ENCOUNTER — Emergency Department
Admission: EM | Admit: 2017-04-14 | Discharge: 2017-04-15 | Disposition: A | Discharge status: Home / Self Care | Payer: Medicaid Other | Attending: Emergency Medicine | Admitting: Emergency Medicine

## 2017-04-14 DIAGNOSIS — Z79899 Other long term (current) drug therapy: Secondary | ICD-10-CM | POA: Insufficient documentation

## 2017-04-14 DIAGNOSIS — F329 Major depressive disorder, single episode, unspecified: Secondary | ICD-10-CM | POA: Diagnosis present

## 2017-04-14 DIAGNOSIS — R45851 Suicidal ideations: Secondary | ICD-10-CM | POA: Diagnosis not present

## 2017-04-14 DIAGNOSIS — D649 Anemia, unspecified: Secondary | ICD-10-CM | POA: Diagnosis not present

## 2017-04-14 DIAGNOSIS — F32A Depression, unspecified: Secondary | ICD-10-CM

## 2017-04-14 LAB — URINALYSIS, COMPLETE (UACMP) WITH MICROSCOPIC
Bilirubin Urine: NEGATIVE
Glucose, UA: NEGATIVE mg/dL
Hgb urine dipstick: NEGATIVE
Ketones, ur: 5 mg/dL — AB
Nitrite: NEGATIVE
Protein, ur: 30 mg/dL — AB
Specific Gravity, Urine: 1.025 (ref 1.005–1.030)
pH: 5 (ref 5.0–8.0)

## 2017-04-14 LAB — CBC
HCT: 21.9 % — ABNORMAL LOW (ref 35.0–47.0)
Hemoglobin: 6.4 g/dL — ABNORMAL LOW (ref 12.0–16.0)
MCH: 19 pg — ABNORMAL LOW (ref 26.0–34.0)
MCHC: 29.3 g/dL — ABNORMAL LOW (ref 32.0–36.0)
MCV: 64.8 fL — ABNORMAL LOW (ref 80.0–100.0)
Platelets: 475 10*3/uL — ABNORMAL HIGH (ref 150–440)
RBC: 3.38 MIL/uL — ABNORMAL LOW (ref 3.80–5.20)
RDW: 17.7 % — ABNORMAL HIGH (ref 11.5–14.5)
WBC: 5.9 10*3/uL (ref 3.6–11.0)

## 2017-04-14 LAB — COMPREHENSIVE METABOLIC PANEL
ALT: 10 U/L — ABNORMAL LOW (ref 14–54)
AST: 21 U/L (ref 15–41)
Albumin: 4.6 g/dL (ref 3.5–5.0)
Alkaline Phosphatase: 80 U/L (ref 38–126)
Anion gap: 7 (ref 5–15)
BUN: 9 mg/dL (ref 6–20)
CO2: 25 mmol/L (ref 22–32)
Calcium: 9.1 mg/dL (ref 8.9–10.3)
Chloride: 105 mmol/L (ref 101–111)
Creatinine, Ser: 0.71 mg/dL (ref 0.44–1.00)
GFR calc Af Amer: >60 mL/min (ref >60)
GFR calc non Af Amer: >60 mL/min (ref >60)
Glucose, Bld: 108 mg/dL — ABNORMAL HIGH (ref 65–99)
Potassium: 3.6 mmol/L (ref 3.5–5.1)
Sodium: 137 mmol/L (ref 135–145)
Total Bilirubin: 0.5 mg/dL (ref 0.3–1.2)
Total Protein: 8.4 g/dL — ABNORMAL HIGH (ref 6.5–8.1)

## 2017-04-14 LAB — URINE DRUG SCREEN, QUALITATIVE (ARMC ONLY)
Amphetamines, Ur Screen: NOT DETECTED
Barbiturates, Ur Screen: NOT DETECTED
Benzodiazepine, Ur Scrn: NOT DETECTED
Cannabinoid 50 Ng, Ur ~~LOC~~: NOT DETECTED
Cocaine Metabolite,Ur ~~LOC~~: NOT DETECTED
MDMA (Ecstasy)Ur Screen: NOT DETECTED
Methadone Scn, Ur: NOT DETECTED
Opiate, Ur Screen: NOT DETECTED
Phencyclidine (PCP) Ur S: NOT DETECTED
Tricyclic, Ur Screen: NOT DETECTED

## 2017-04-14 LAB — ETHANOL: Alcohol, Ethyl (B): <5 mg/dL (ref <5)

## 2017-04-14 LAB — ACETAMINOPHEN LEVEL: Acetaminophen (Tylenol), Serum: <10 ug/mL — ABNORMAL LOW (ref 10–30)

## 2017-04-14 LAB — SALICYLATE LEVEL: Salicylate Lvl: <7 mg/dL (ref 2.8–30.0)

## 2017-04-14 LAB — POCT PREGNANCY, URINE: Preg Test, Ur: NEGATIVE

## 2017-04-14 MED ORDER — FERROUS GLUCONATE 324 (38 FE) MG PO TABS
324.0000 mg | ORAL_TABLET | Freq: Every day | ORAL | Status: DC
  Administered 2017-04-14: 324 mg via ORAL
  Filled 2017-04-14: qty 1

## 2017-04-14 MED ORDER — FERROUS GLUCONATE 324 (38 FE) MG PO TABS: 324.0000 mg | ORAL_TABLET | Freq: Every day | ORAL | 3 refills | Status: DC

## 2017-04-14 NOTE — ED Notes (Signed)
TTS at bedside at this time.  

## 2017-04-14 NOTE — ED Triage Notes (Signed)
Pt was dropped off today at [facility] by her parent helper. Pt stated to parent helper that she "feels as if her children would be better off without" her. Pt is being threatened with eviction from her home. Pt has family in area and her children are with them. Pt denies plan for harming self at this time. Pt is A&O x 4 w/o distress at this time.

## 2017-04-14 NOTE — ED Notes (Signed)
SOC placed at bedside at this time.
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
## 2017-04-14 NOTE — ED Provider Notes (Signed)
[facility] Emergency Department Provider Note   ____________________________________________   First MD Initiated Contact with Patient 04/14/17 1934     (approximate)  I have reviewed the triage vital signs and the nursing notes.   HISTORY  Chief Complaint Suicidal    HPI Elizabeth Zimmerman is a 31 y.o. female who comes from [facility] under commitment. At [facility] she was suicidal. She is still depressed. She is also reporting very heavy menstrual periods. She has no other symptoms. She is anemic by lab.   History reviewed. No pertinent past medical history.  Patient Active Problem List   Diagnosis Date Noted  . Labor and delivery, indication for care 02/28/2015    Past Surgical History:  Procedure Laterality Date  . CESAREAN SECTION      Prior to Admission medications   Medication Sig Start Date End Date Taking? Authorizing Provider  citalopram (CELEXA) 20 MG tablet Take 20 mg by mouth daily.   Yes [provider]  Prenatal Vit-Fe Fumarate-FA (PRENATAL MULTIVITAMIN) TABS tablet Take 1 tablet by mouth daily at 12 noon.    [provider]    Allergies Patient has no known allergies.  History reviewed. No pertinent family history.  Social History Social History  Substance Use Topics  . Smoking status: Never Smoker  . Smokeless tobacco: Never Used  . Alcohol use No    Review of Systems  Constitutional: No fever/chills Eyes: No visual changes. ENT: No sore throat. Cardiovascular: Denies chest pain. Respiratory: Denies shortness of breath. Gastrointestinal: No abdominal pain.  No nausea, no vomiting.  No diarrhea.  No constipation. Genitourinary: Negative for dysuria. Musculoskeletal: Negative for back pain. Skin: Negative for rash. Neurological: Negative for headaches, focal weakness or numbness.  ____________________________________________   PHYSICAL EXAM:  VITAL SIGNS: ED Triage Vitals  Enc Vitals Group     BP  04/14/17 1904 132/61     Pulse Rate 04/14/17 1904 75     Resp 04/14/17 1904 20     Temp 04/14/17 1904 98.5 F (36.9 C)     Temp Source 04/14/17 1904 Oral     SpO2 04/14/17 1904 100 %     Weight 04/14/17 1905 195 lb (88.5 kg)     Height 04/14/17 1905 5\' 2"  (1.575 m)     Head Circumference --      Peak Flow --      Pain Score --      Pain Loc --      Pain Edu? --      Excl. in GC? --     Constitutional: Alert and oriented. Well appearing and in no acute distress. Eyes: Conjunctivae are normal.  Head: Atraumatic. Nose: No congestion/rhinnorhea. Mouth/Throat: Mucous membranes are moist.  Oropharynx non-erythematous. Neck: No stridor.  Cardiovascular: Normal rate, regular rhythm. Grossly normal heart sounds.  Good peripheral circulation. Respiratory: Normal respiratory effort.  No retractions. Lungs CTAB. Gastrointestinal: Soft and nontender. No distention. No abdominal bruits. No CVA tenderness. Musculoskeletal: No lower extremity tenderness nor edema.  No joint effusions. Neurologic:  Normal speech and language. No gross focal neurologic deficits are appreciated.. Skin:  Skin is warm, dry and intact. No rash noted.   ____________________________________________   LABS (all labs ordered are listed, but only abnormal results are displayed)  Labs Reviewed  COMPREHENSIVE METABOLIC PANEL - Abnormal; Notable for the following:       Result Value   Glucose, Bld 108 (*)    Total Protein 8.4 (*)  ALT 10 (*)    All other components within normal limits  ACETAMINOPHEN LEVEL - Abnormal; Notable for the following:    Acetaminophen (Tylenol), Serum <10 (*)    All other components within normal limits  CBC - Abnormal; Notable for the following:    RBC 3.38 (*)    Hemoglobin 6.4 (*)    HCT 21.9 (*)    MCV 64.8 (*)    MCH 19.0 (*)    MCHC 29.3 (*)    RDW 17.7 (*)    Platelets 475 (*)    All other components within normal limits  ETHANOL  SALICYLATE LEVEL  URINE DRUG SCREEN,  QUALITATIVE ([facility] ONLY)  URINALYSIS, COMPLETE (UACMP) WITH MICROSCOPIC  POC URINE PREG, ED  POCT PREGNANCY, URINE   ____________________________________________  EKG   ____________________________________________  RADIOLOGY   ____________________________________________   PROCEDURES  Procedure(s) performed:  Procedures  Critical Care performed:   ____________________________________________   INITIAL IMPRESSION / ASSESSMENT AND PLAN / ED COURSE  Pertinent labs & imaging results that were available during my care of the patient were reviewed by me and considered in my medical decision making (see chart for details).        ____________________________________________   FINAL CLINICAL IMPRESSION(S) / ED DIAGNOSES  Final diagnoses:  Depression, unspecified depression type  Anemia, unspecified type      NEW MEDICATIONS STARTED DURING THIS VISIT:  New Prescriptions   No medications on file     Note:  This document was prepared using Dragon voice recognition software and may include unintentional dictation errors.    Arnaldo Natal, MD 04/14/17 [phone]

## 2017-04-14 NOTE — ED Notes (Signed)
SOC    CALLED 

## 2017-04-14 NOTE — ED Notes (Signed)
This RN addressed pt's low hemoglobin with MD. Pt states she has heavy periods, treat with iron pill, pt is asymptomatic at this time.

## 2017-04-14 NOTE — Discharge Instructions (Signed)
Please follow-up with [facility]. Please return immediately if you feel like he wanted to hurt herself or anyone else. Please follow-up with her regular family doctor as well. You are anemic take the iron once a day. Be very careful not to let your children get into the iron pills oftentimes they look like candy but they are very toxic for children.

## 2017-04-14 NOTE — ED Notes (Signed)
PT SOC REVERSED/ TO BE DISCHARGED

## 2017-04-15 NOTE — ED Notes (Signed)
Pt. Given belongings to change into 

## 2017-04-15 NOTE — ED Notes (Signed)
Called Aunt of patient Julious Oka) [phone], stated she would be able to pick up patient in one hour from now.

## 2017-07-17 ENCOUNTER — Ambulatory Visit: Payer: Self-pay

## 2017-07-17 ENCOUNTER — Telehealth: Payer: Self-pay | Admitting: Pharmacy Technician

## 2017-07-17 NOTE — Telephone Encounter (Signed)
Patient no-show for eligibility appointment on 07/17/2017 at 9:00a.m.  Contacted patient.  Patient stated that she overslept.  Needed to reschedule.  Made another appointment for eligibility on 07/19/17 at 3:30p.m.  Sherilyn DacostaBetty J. Fredia Zimmerman Care Manager [facility]

## 2017-07-19 ENCOUNTER — Ambulatory Visit: Payer: Self-pay | Admitting: Pharmacy Technician

## 2017-07-25 ENCOUNTER — Ambulatory Visit: Payer: Self-pay | Admitting: Pharmacy Technician

## 2017-07-25 DIAGNOSIS — Z79899 Other long term (current) drug therapy: Secondary | ICD-10-CM

## 2017-07-25 NOTE — Progress Notes (Signed)
Completed [facility] application and contract.  Patient agreed to all terms of the [facility] contract.  Patient approved to receive medication assistance through 2018 at [facility], as long as eligibility criteria continues to be met.  Provided patient with community resource material based on her particular needs.    Elizabeth Zimmerman Care Manager [facility]  

## 2017-11-03 ENCOUNTER — Telehealth: Payer: Self-pay | Admitting: Pharmacy Technician

## 2017-11-03 NOTE — Telephone Encounter (Signed)
Received updated proof of income.  Patient eligible to receive medication assistance at [facility] through 2019, as long as eligibility requirements continue to be met.  [facility]

## 2017-12-18 ENCOUNTER — Other Ambulatory Visit: Payer: Self-pay

## 2017-12-18 ENCOUNTER — Emergency Department: Payer: Medicaid Other

## 2017-12-18 ENCOUNTER — Encounter: Payer: Self-pay | Admitting: Emergency Medicine

## 2017-12-18 ENCOUNTER — Emergency Department
Admission: EM | Admit: 2017-12-18 | Discharge: 2017-12-18 | Disposition: A | Discharge status: Home / Self Care | Payer: Medicaid Other | Attending: Student in an Organized Health Care Education/Training Program | Admitting: Student in an Organized Health Care Education/Training Program

## 2017-12-18 DIAGNOSIS — O209 Hemorrhage in early pregnancy, unspecified: Secondary | ICD-10-CM | POA: Diagnosis present

## 2017-12-18 DIAGNOSIS — Z79899 Other long term (current) drug therapy: Secondary | ICD-10-CM | POA: Insufficient documentation

## 2017-12-18 DIAGNOSIS — O418X10 Other specified disorders of amniotic fluid and membranes, first trimester, not applicable or unspecified: Secondary | ICD-10-CM

## 2017-12-18 DIAGNOSIS — O4690 Antepartum hemorrhage, unspecified, unspecified trimester: Secondary | ICD-10-CM

## 2017-12-18 DIAGNOSIS — Z3A01 Less than 8 weeks gestation of pregnancy: Secondary | ICD-10-CM | POA: Diagnosis not present

## 2017-12-18 DIAGNOSIS — O99321 Drug use complicating pregnancy, first trimester: Secondary | ICD-10-CM | POA: Diagnosis not present

## 2017-12-18 DIAGNOSIS — O208 Other hemorrhage in early pregnancy: Secondary | ICD-10-CM | POA: Diagnosis not present

## 2017-12-18 DIAGNOSIS — O468X1 Other antepartum hemorrhage, first trimester: Secondary | ICD-10-CM

## 2017-12-18 DIAGNOSIS — O9989 Other specified diseases and conditions complicating pregnancy, childbirth and the puerperium: Secondary | ICD-10-CM | POA: Diagnosis not present

## 2017-12-18 DIAGNOSIS — R102 Pelvic and perineal pain: Secondary | ICD-10-CM | POA: Diagnosis not present

## 2017-12-18 DIAGNOSIS — F1590 Other stimulant use, unspecified, uncomplicated: Secondary | ICD-10-CM | POA: Diagnosis not present

## 2017-12-18 HISTORY — DX: Complete or unspecified spontaneous abortion without complication: O03.9

## 2017-12-18 LAB — CBC
HCT: 26.5 % — ABNORMAL LOW (ref 35.0–47.0)
Hemoglobin: 8.3 g/dL — ABNORMAL LOW (ref 12.0–16.0)
MCH: 21.7 pg — ABNORMAL LOW (ref 26.0–34.0)
MCHC: 31.4 g/dL — ABNORMAL LOW (ref 32.0–36.0)
MCV: 69.1 fL — ABNORMAL LOW (ref 80.0–100.0)
Platelets: 391 10*3/uL (ref 150–440)
RBC: 3.84 MIL/uL (ref 3.80–5.20)
RDW: 19.2 % — ABNORMAL HIGH (ref 11.5–14.5)
WBC: 8.1 10*3/uL (ref 3.6–11.0)

## 2017-12-18 LAB — ABO/RH: ABO/RH(D): A POS

## 2017-12-18 LAB — HCG, QUANTITATIVE, PREGNANCY: hCG, Beta Chain, Quant, S: 125890 m[IU]/mL — ABNORMAL HIGH (ref <5)

## 2017-12-18 NOTE — ED Triage Notes (Signed)
Says [redacted] week pregnant and is having vaginal bleeding started yesterday.  Cramping.

## 2017-12-18 NOTE — ED Provider Notes (Signed)
The Elizabeth Zimmerman    First MD Initiated Contact with Patient 12/18/17 1528     (approximate)  I have reviewed the triage vital signs and the nursing notes.   HISTORY  Chief Complaint Vaginal Bleeding    HPI Elizabeth Zimmerman is a 32 y.o. female [phone] states she is roughly [redacted] weeks pregnant by LMP presents to the ER with some crampy pelvic pain as well as vaginal spotting started yesterday evening.  No heavy bleeding.  No clots or past products of conception.  States that her last pregnancy did end in a stillbirth.  She denies any chronic medical conditions.  Denies any bleeding disorders.  Denies any fevers.    Past Medical History:  Diagnosis Date  . Miscarriage 2017   No family history on file. Past Surgical History:  Procedure Laterality Date  . CESAREAN SECTION     Patient Active Problem List   Diagnosis Date Noted  . Labor and delivery, indication for care 02/28/2015      Prior to Admission medications   Medication Sig Start Date End Date Taking? Authorizing Provider  citalopram (CELEXA) 20 MG tablet Take 20 mg by mouth daily.    [provider]  ferrous gluconate (FERGON) 324 MG tablet Take 1 tablet (324 mg total) by mouth daily with breakfast. Be very careful this medicine is poisonous for children 04/14/17   Arnaldo NatalMalinda, Paul F, MD    Allergies Patient has no known allergies.    Social History Social History   Tobacco Use  . Smoking status: Never Smoker  . Smokeless tobacco: Never Used  Substance Use Topics  . Alcohol use: No  . Drug use: No    Review of Systems Patient denies headaches, rhinorrhea, blurry vision, numbness, shortness of breath, chest pain, edema, cough, abdominal pain, nausea, vomiting, diarrhea, dysuria, fevers, rashes or hallucinations unless otherwise stated above in HPI. ____________________________________________   PHYSICAL EXAM:  VITAL SIGNS: Vitals:   12/18/17 1352  BP: 118/77  Pulse: 79  Resp: 14  Temp: 99.3 F (37.4 C)  SpO2: 100%    Constitutional: Alert and oriented. Well appearing and in no acute distress. Eyes: Conjunctivae are normal.  Head: Atraumatic. Nose: No congestion/rhinnorhea. Mouth/Throat: Mucous membranes are moist.   Neck: No stridor. Painless ROM.  Cardiovascular: Normal rate, regular rhythm. Grossly normal heart sounds.  Good peripheral circulation. Respiratory: Normal respiratory effort.  No retractions. Lungs CTAB. Gastrointestinal: Soft and nontender. No distention. No abdominal bruits. No CVA tenderness. Genitourinary: deferred Musculoskeletal: No lower extremity tenderness nor edema.  No joint effusions. Neurologic:  Normal speech and language. No gross focal neurologic deficits are appreciated. No facial droop Skin:  Skin is warm, dry and intact. No rash noted. Psychiatric:  Speech and behavior are normal.  ____________________________________________   LABS (all labs ordered are listed, but only abnormal results are displayed)  Results for orders placed or performed during the hospital encounter of 12/18/17 (from the past 24 hour(s))  ABO/Rh     Status: None   Collection Time: 12/18/17  1:57 PM  Result Value Ref Range   ABO/RH(D)      A POS Performed at [facility], [address] [place], [place] [address]   CBC     Status: Abnormal   Collection Time: 12/18/17  1:57 PM  Result Value Ref Range   WBC 8.1 3.6 - 11.0 K/uL   RBC 3.84 3.80 - 5.20 MIL/uL   Hemoglobin 8.3 (L) 12.0 -  16.0 g/dL   HCT 16.1 (L) 09.6 - 04.5 %   MCV 69.1 (L) 80.0 - 100.0 fL   MCH 21.7 (L) 26.0 - 34.0 pg   MCHC 31.4 (L) 32.0 - 36.0 g/dL   RDW 40.9 (H) 81.1 - 91.4 %   Platelets 391 150 - 440 K/uL  hCG, quantitative, pregnancy     Status: Abnormal   Collection Time: 12/18/17  1:57 PM  Result Value Ref Range   hCG, Beta Chain, Quant, S 125,890 (H) <5 mIU/mL    ____________________________________________ ____________________________________________  RADIOLOGY  I personally reviewed all radiographic images ordered to evaluate for the above acute complaints and reviewed radiology reports and findings.  These findings were personally discussed with the patient.  Please see medical record for radiology report.  ____________________________________________   PROCEDURES  Procedure(s) performed:  Procedures    Critical Care performed: no ____________________________________________   INITIAL IMPRESSION / ASSESSMENT AND PLAN / ED COURSE  Pertinent labs & imaging results that were available during my care of the patient were reviewed by me and considered in my medical decision making (see chart for details).  DDX: Ectopic, threatened miscarriage, inevitable miscarriage, subchorionic hematoma  Elizabeth Zimmerman is a 32 y.o. who presents to the ED with bleeding in early pregnancy.  Ultrasound shows IUP with subchorionic hematoma.  No evidence of ectopic.  Patient is Rh+.  Otherwise hemodynamically stable.  Stable and appropriate for outpatient follow-up.      As part of my medical decision making, I reviewed the following data within the electronic MEDICAL RECORD NUMBER Nursing notes reviewed and incorporated, Labs reviewed, notes from prior ED visits.  ____________________________________________   FINAL CLINICAL IMPRESSION(S) / ED DIAGNOSES  Final diagnoses:  Vaginal bleeding in pregnancy  Subchorionic hematoma in first trimester, single or unspecified fetus      NEW MEDICATIONS STARTED DURING THIS VISIT:  New Prescriptions   No medications on file     Zimmerman:  This document was prepared using Dragon voice recognition software and may include unintentional dictation errors.    Willy Eddy, MD 12/18/17 [phone]

## 2018-02-14 ENCOUNTER — Other Ambulatory Visit: Payer: Self-pay | Admitting: Family Medicine

## 2018-02-14 DIAGNOSIS — F4320 Adjustment disorder, unspecified: Secondary | ICD-10-CM

## 2018-02-14 DIAGNOSIS — F4310 Post-traumatic stress disorder, unspecified: Secondary | ICD-10-CM

## 2018-02-14 DIAGNOSIS — E669 Obesity, unspecified: Secondary | ICD-10-CM

## 2018-02-14 DIAGNOSIS — Z3201 Encounter for pregnancy test, result positive: Secondary | ICD-10-CM

## 2018-03-05 ENCOUNTER — Ambulatory Visit: Payer: Medicaid Other

## 2018-03-05 ENCOUNTER — Ambulatory Visit
Admission: RE | Admit: 2018-03-05 | Discharge: 2018-03-05 | Disposition: A | Discharge status: Home / Self Care | Payer: Medicaid Other | Source: Ambulatory Visit | Attending: Maternal & Fetal Medicine | Admitting: Maternal & Fetal Medicine

## 2018-03-05 DIAGNOSIS — O99212 Obesity complicating pregnancy, second trimester: Secondary | ICD-10-CM | POA: Insufficient documentation

## 2018-03-05 DIAGNOSIS — O99342 Other mental disorders complicating pregnancy, second trimester: Secondary | ICD-10-CM | POA: Insufficient documentation

## 2018-03-05 DIAGNOSIS — F4310 Post-traumatic stress disorder, unspecified: Secondary | ICD-10-CM

## 2018-03-05 DIAGNOSIS — F4320 Adjustment disorder, unspecified: Secondary | ICD-10-CM

## 2018-03-05 DIAGNOSIS — Z3A19 19 weeks gestation of pregnancy: Secondary | ICD-10-CM | POA: Insufficient documentation

## 2018-03-05 DIAGNOSIS — E669 Obesity, unspecified: Secondary | ICD-10-CM | POA: Diagnosis not present

## 2018-03-05 DIAGNOSIS — Z3201 Encounter for pregnancy test, result positive: Secondary | ICD-10-CM

## 2018-05-07 IMAGING — US US OB LIMITED
1 series · 14 of 28 positions shown · non-contrast
Comparison: 01/20/2016

CLINICAL DATA: Vaginal bleeding and decreasing beta HCG level

EXAM:
LIMITED OBSTETRIC ULTRASOUND

[Series 1: us ob limited · 0.22mm/px · 14 of 50 slices shown]
[im 2/50]
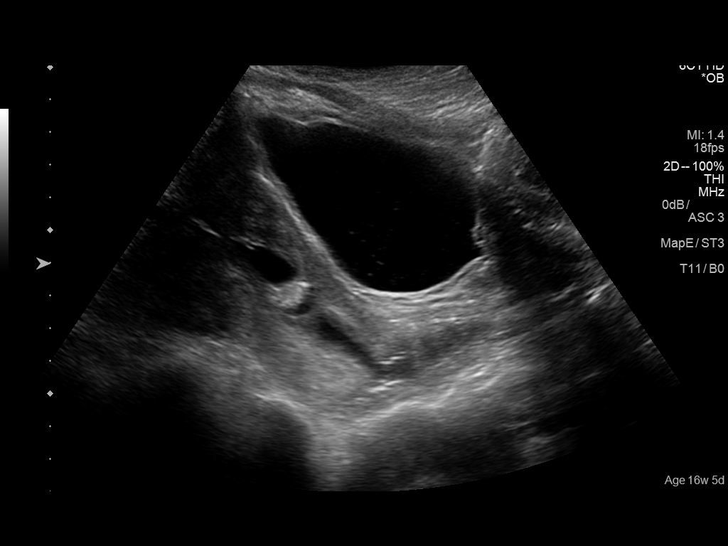
[im 6/50]
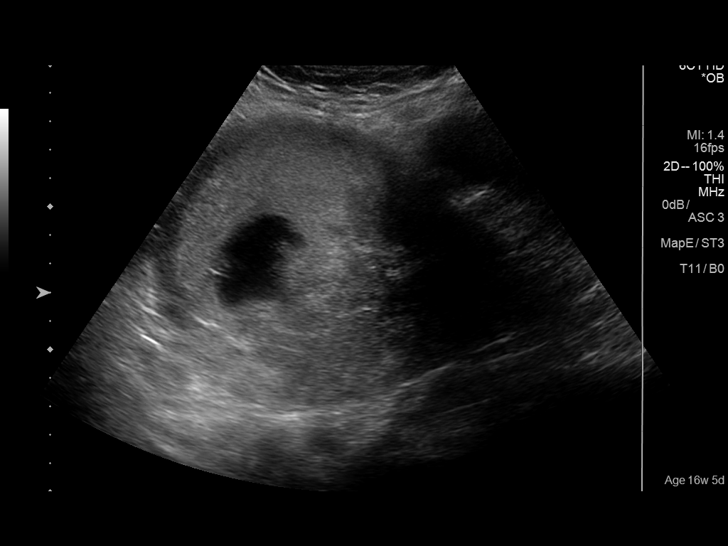
[im 10/50]
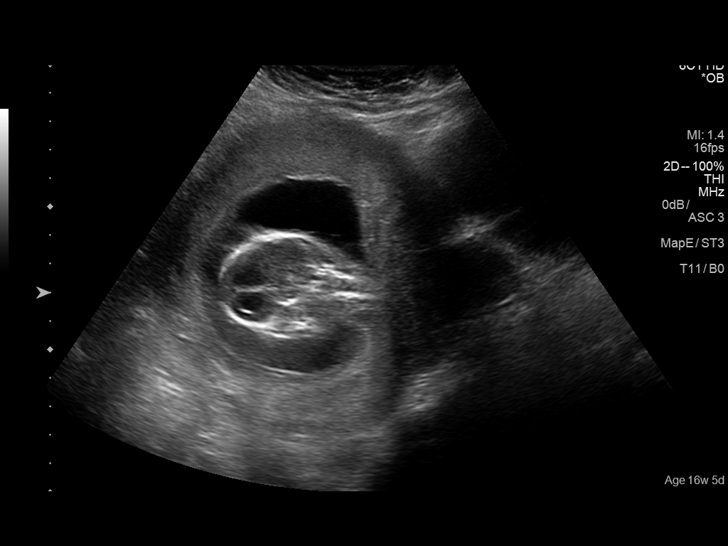
[im 13/50]
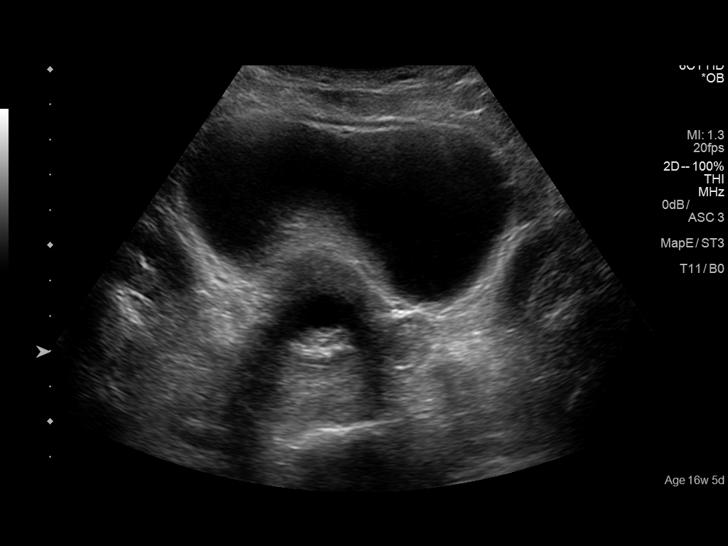
[im 17/50]
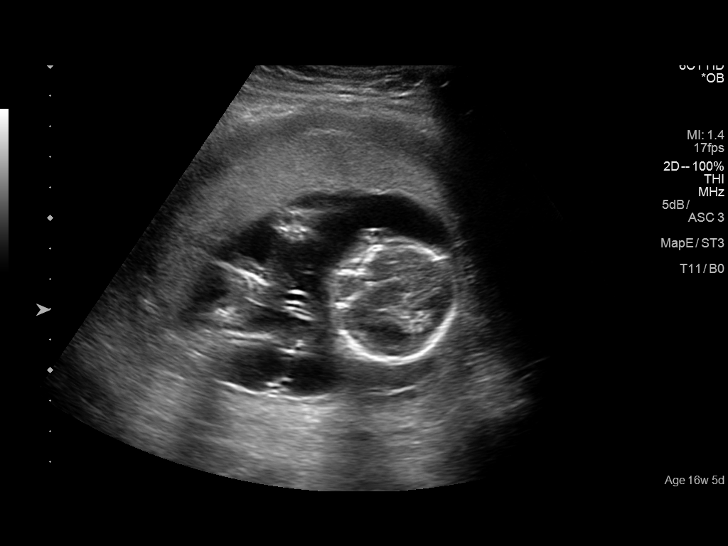
[im 20/50]
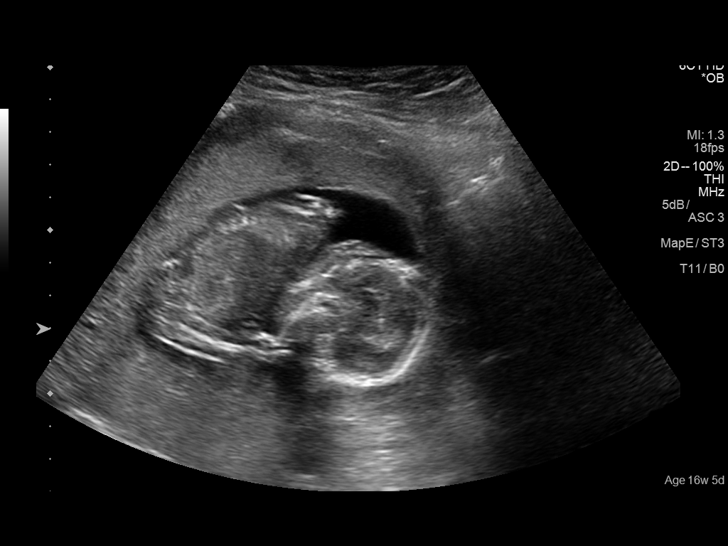
[im 24/50]
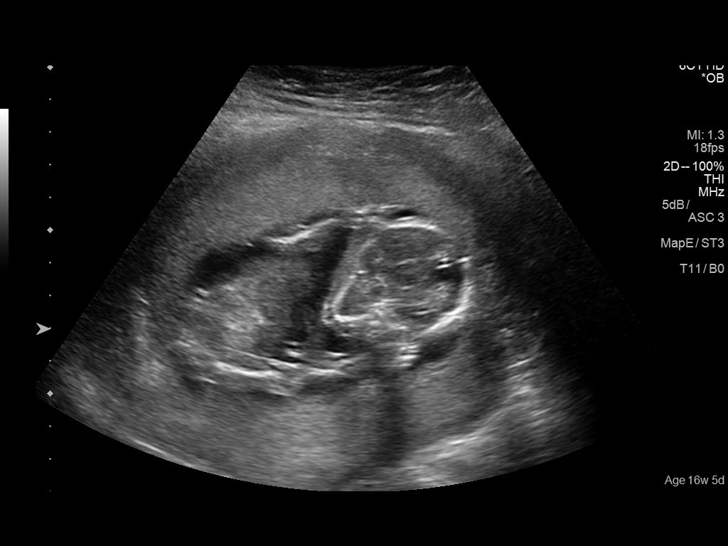
[im 28/50]
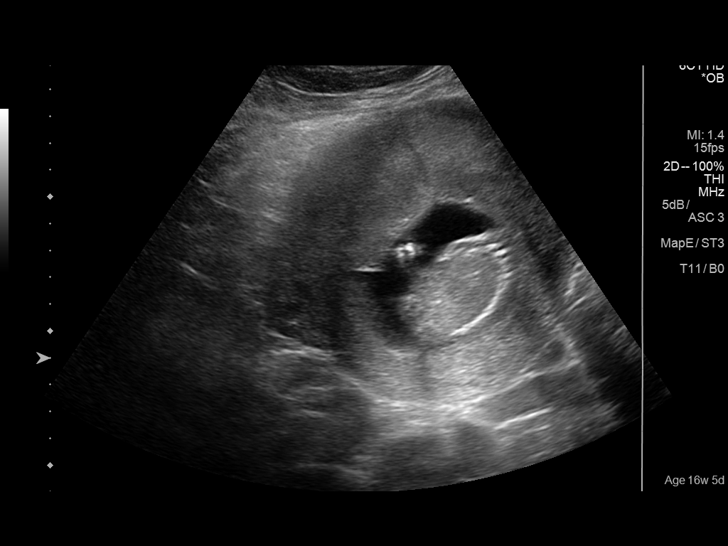
[im 31/50]
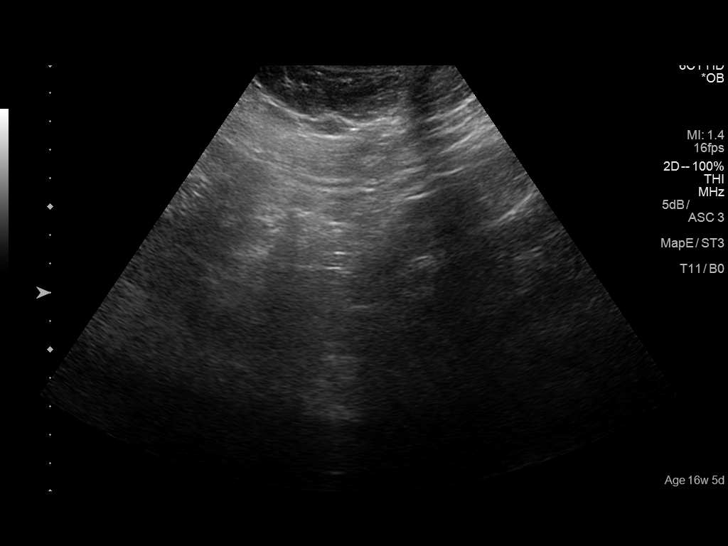
[im 35/50]
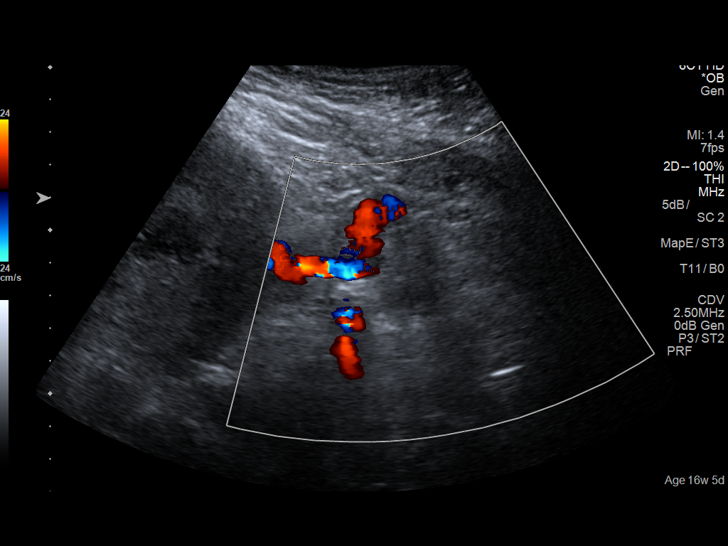
[im 39/50]
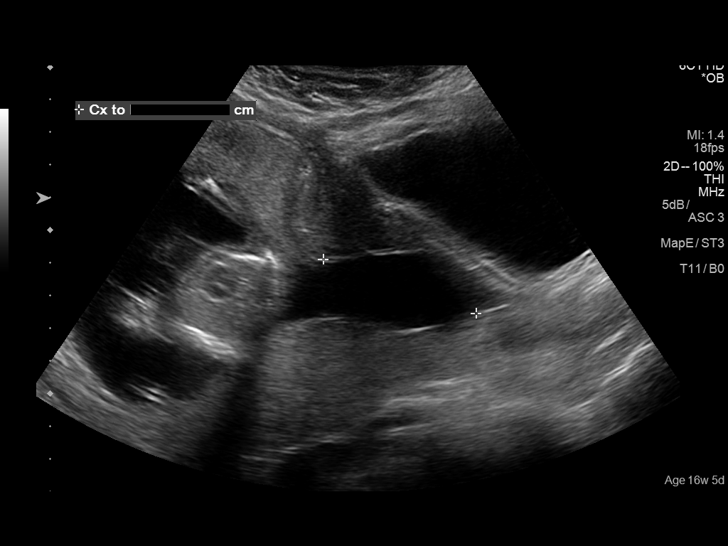
[im 42/50]
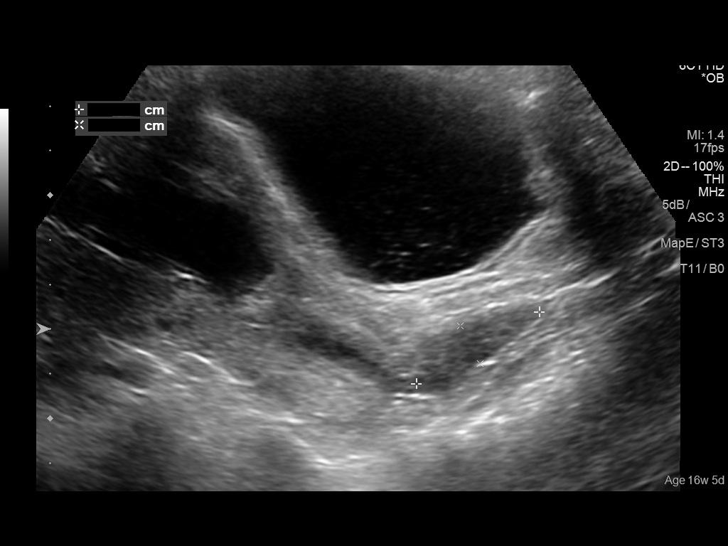
[im 46/50]
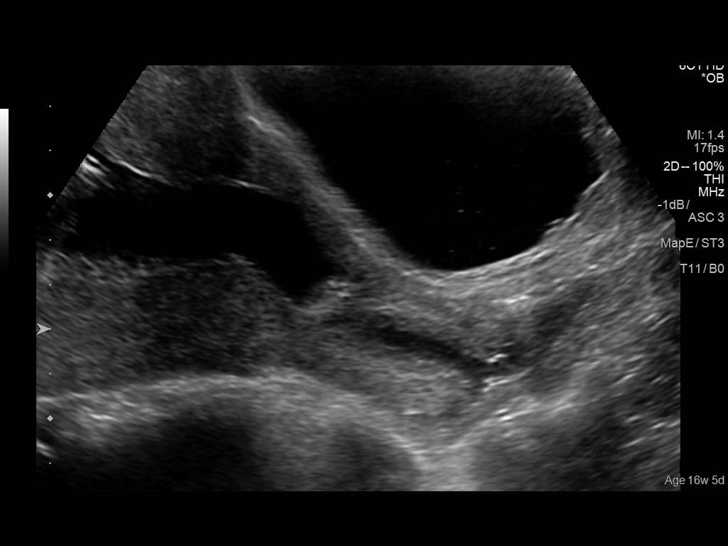
[im 50/50]
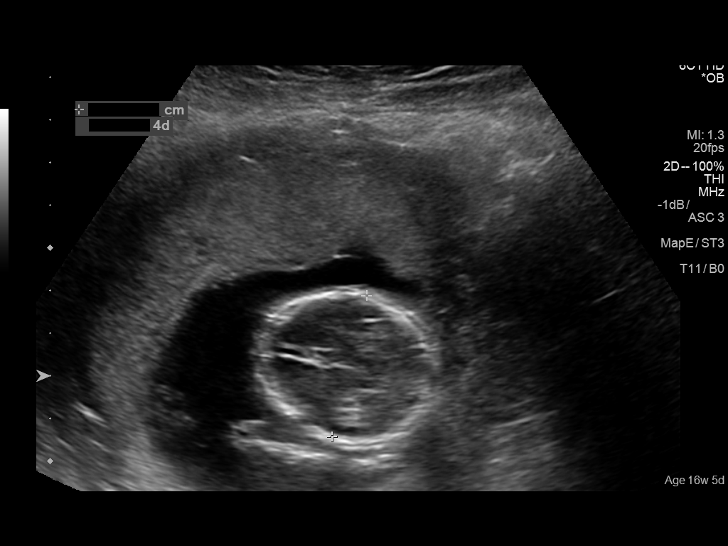

[14 of 28 positions shown; findings below may reference images not displayed]

FINDINGS: Number of Fetuses:  Single

Heart Rate:  158 bpm

Movement:  Noted

Presentation: Transverse with head left

Placental Location: Fundal

Previa: None

Amniotic Fluid (Subjective):  Within normal limits.

BPD:  3.4cm 16w  4d

MATERNAL FINDINGS:

Cervix: There is some suggestion of fluid within the cervical canal.

Uterus/Adnexae: Hypoechoic areas noted within the vaginal vault
which may represent thrombus given the patient's clinical history.
IMPRESSION: Single live intrauterine gestation at 16 weeks 4 days.

There is some decreased echogenicity within the vaginal wall which
may represent thrombus.

This exam is performed on an emergent basis and does not
comprehensively evaluate fetal size, dating, or anatomy; follow-up
complete OB US should be considered if further fetal assessment is
warranted.

## 2018-07-25 ENCOUNTER — Telehealth: Payer: Self-pay | Admitting: Pharmacy Technician

## 2018-07-25 NOTE — Telephone Encounter (Signed)
Patient has Medicaid and is no longer [place] resident.  No longer meets [facility]'s eligibility criteria.  Patient notified.  Sherilyn DacostaBetty J. Essie Gehret Care Manager [facility]

## 2019-03-04 ENCOUNTER — Encounter (HOSPITAL_COMMUNITY): Payer: Self-pay

## 2020-02-26 IMAGING — US US OB COMP LESS 14 WK
1 series · 14 of 27 positions shown · non-contrast
Comparison: None.

CLINICAL DATA: Pelvic pain and bleeding. Estimated gestational age
of 8 weeks, 3 days by LMP.

EXAM:
OBSTETRIC <14 WK ULTRASOUND
TECHNIQUE: Transabdominal ultrasound was performed for evaluation of the
gestation as well as the maternal uterus and adnexal regions.

[Series 1: us ob comp less 14 wk · 0.17mm/px · 27 acquisitions, 14 frames shown]
[im 1/27]
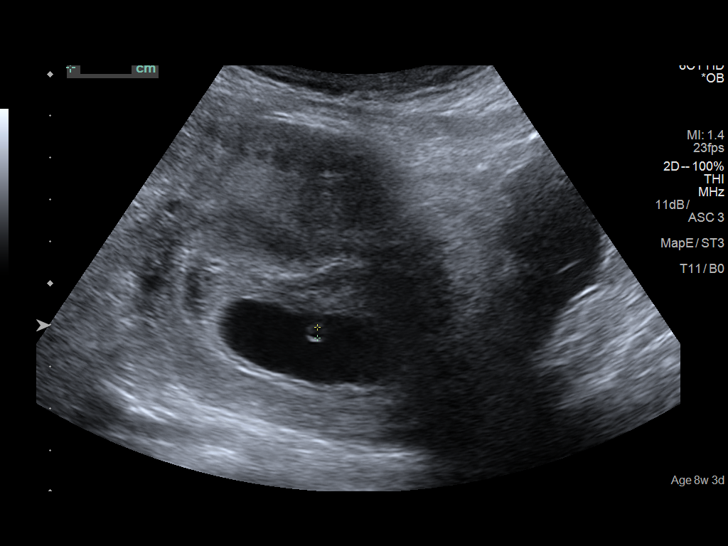
[im 3/27]
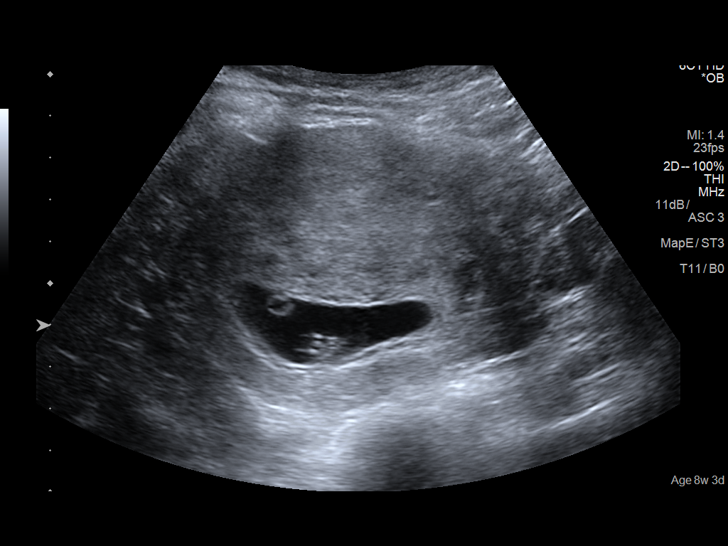
[im 5/27]
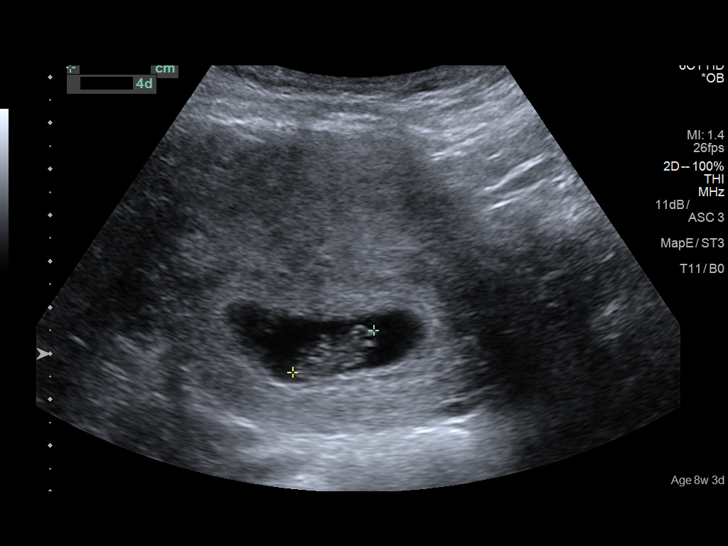
[im 7/27]
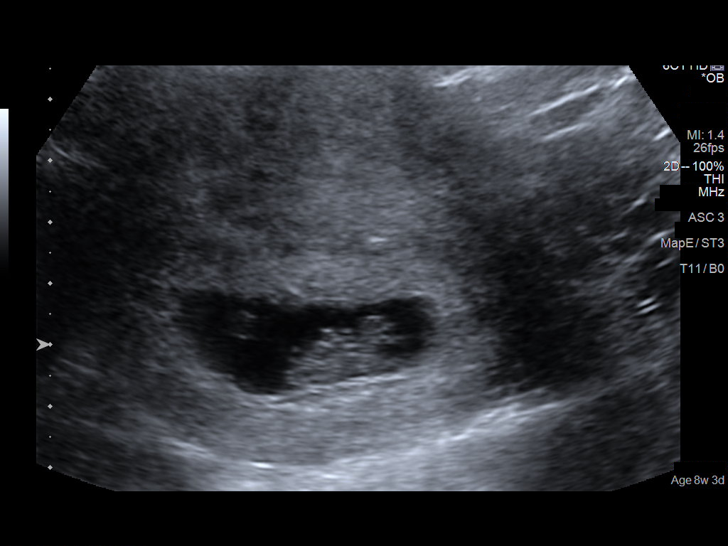
[im 9/27]
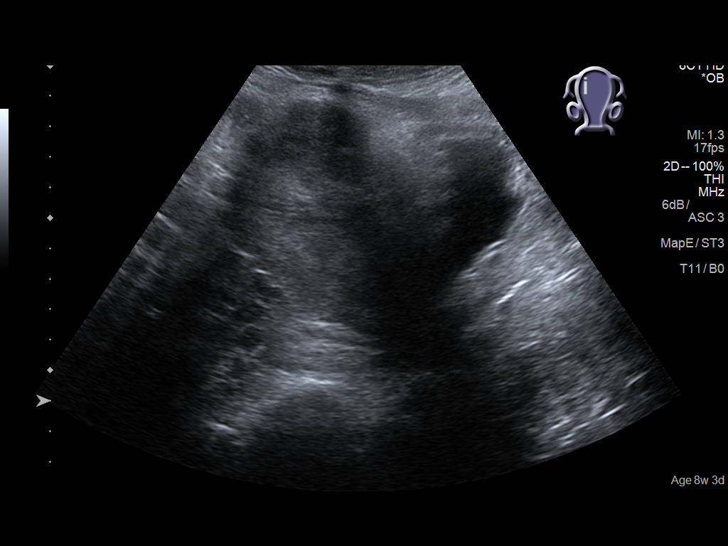
[im 11/27]
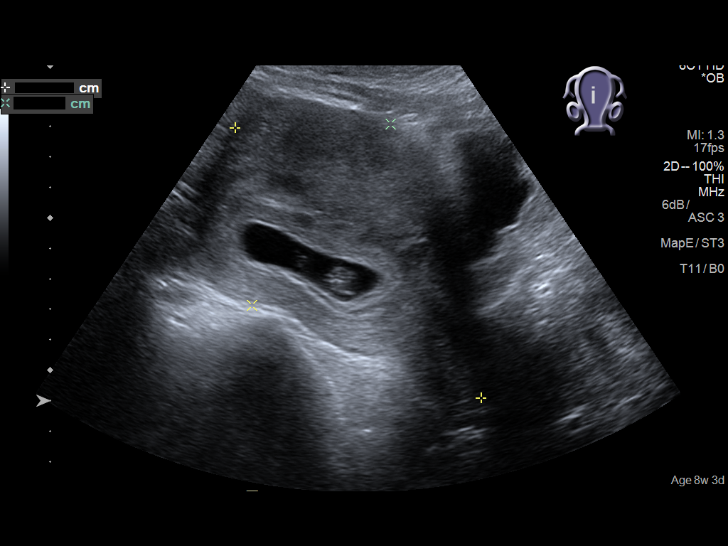
[im 13/27]
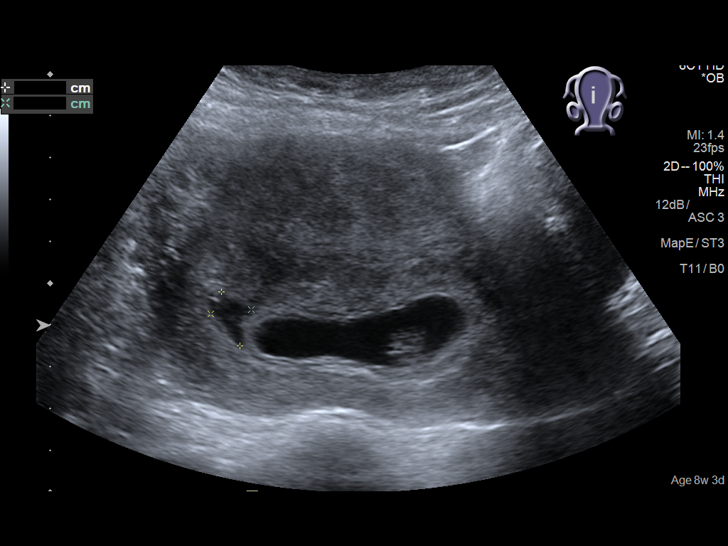
[im 15/27]
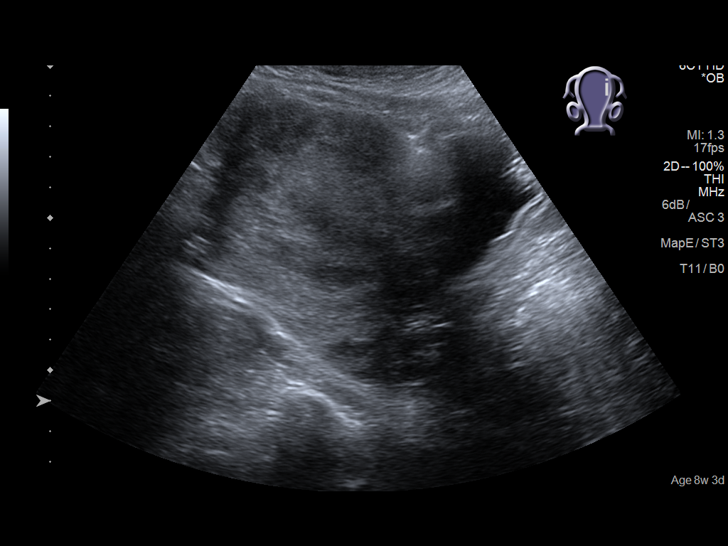
[im 17/27]
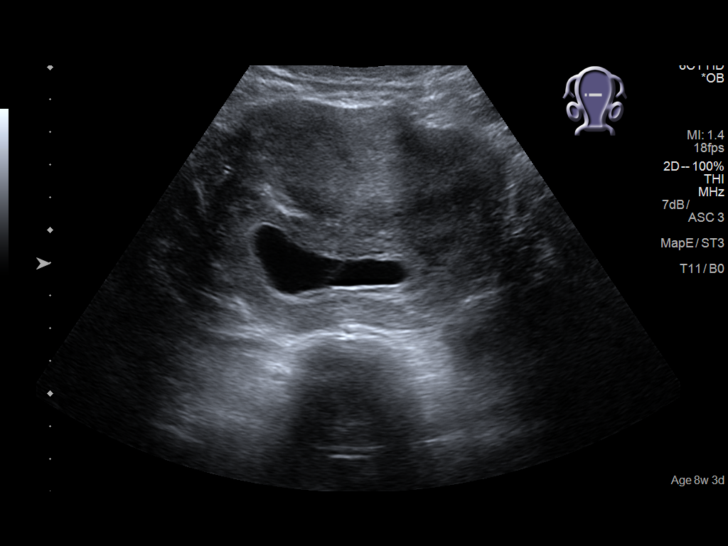
[im 19/27]
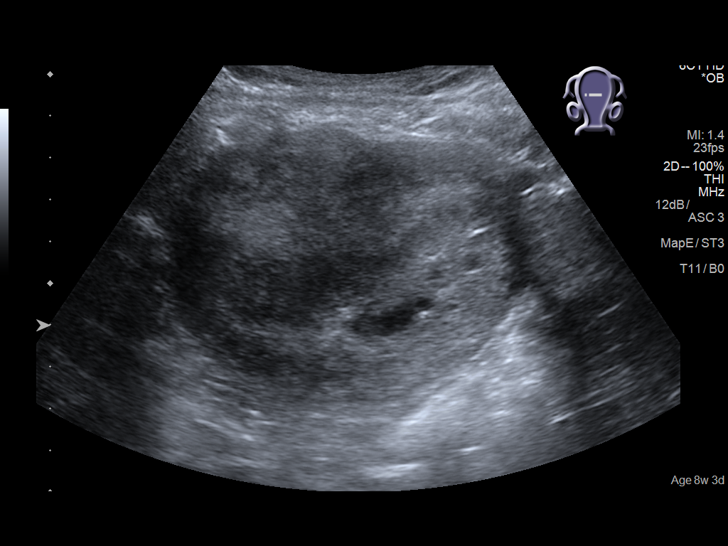
[im 21/27]
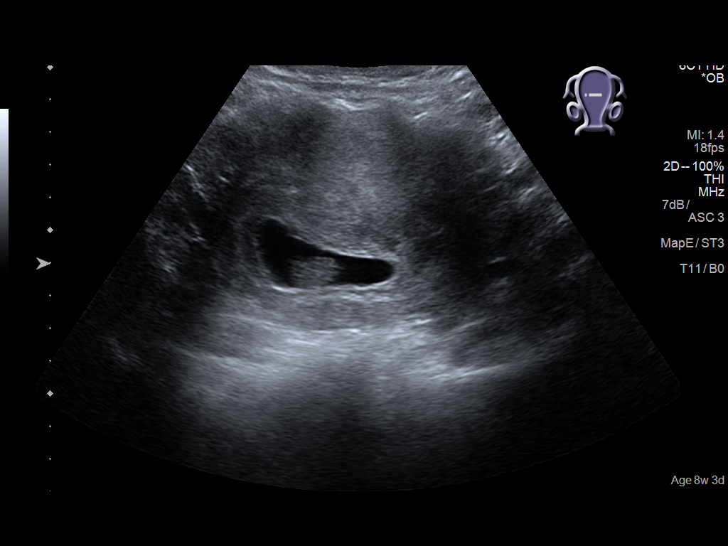
[im 23/27]
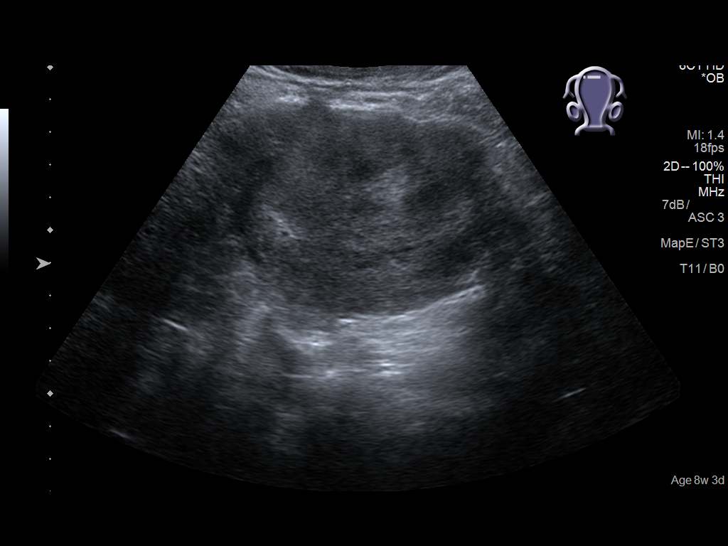
[im 25/27]
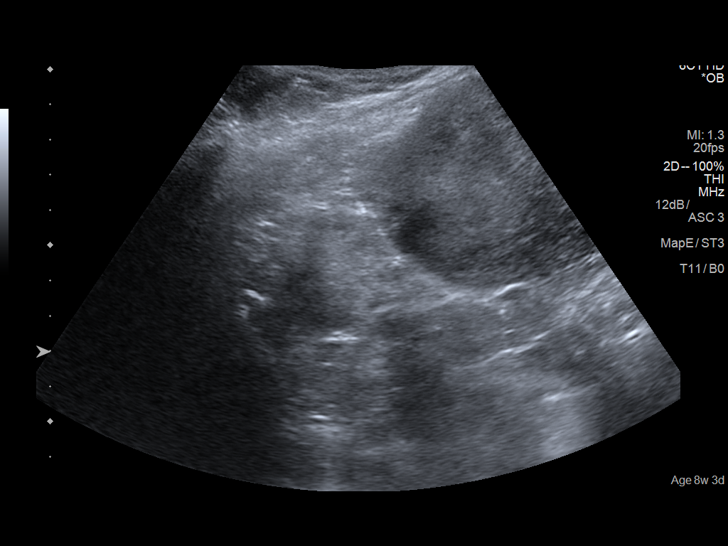
[im 27/27]
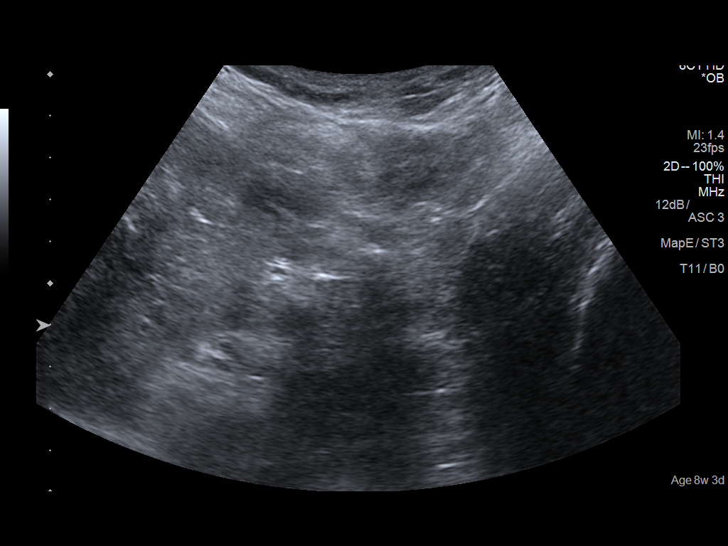

[14 of 27 positions shown; findings below may reference images not displayed]

FINDINGS: Intrauterine gestational sac: Single

Yolk sac:  Visualized.

Embryo:  Visualized.

Cardiac Activity: Visualized.

Heart Rate: 163 bpm

CRL:   20  mm   8 w 4 d                  US EDC: 07/26/2018

Subchorionic hemorrhage:  Small.

Maternal uterus/adnexae: The bilateral ovaries were not visualized.
IMPRESSION: 1. Single, live, intrauterine pregnancy with estimated gestational
age of 8 weeks, 4 days.
2. Small subchorionic hemorrhage.
3. Nonvisualization of the bilateral ovaries. The patient refused
transvaginal exam.

## 2020-10-17 ENCOUNTER — Encounter: Payer: Self-pay | Admitting: Emergency Medicine

## 2020-10-17 ENCOUNTER — Emergency Department
Admission: EM | Admit: 2020-10-17 | Discharge: 2020-10-17 | Disposition: A | Discharge status: Home / Self Care | Payer: Medicaid Other | Attending: Emergency Medicine | Admitting: Emergency Medicine

## 2020-10-17 ENCOUNTER — Emergency Department: Payer: Medicaid Other

## 2020-10-17 ENCOUNTER — Other Ambulatory Visit: Payer: Self-pay

## 2020-10-17 DIAGNOSIS — M545 Low back pain, unspecified: Secondary | ICD-10-CM | POA: Diagnosis not present

## 2020-10-17 DIAGNOSIS — O2312 Infections of bladder in pregnancy, second trimester: Secondary | ICD-10-CM | POA: Insufficient documentation

## 2020-10-17 DIAGNOSIS — O26892 Other specified pregnancy related conditions, second trimester: Secondary | ICD-10-CM | POA: Diagnosis present

## 2020-10-17 DIAGNOSIS — O26899 Other specified pregnancy related conditions, unspecified trimester: Secondary | ICD-10-CM

## 2020-10-17 DIAGNOSIS — O99891 Other specified diseases and conditions complicating pregnancy: Secondary | ICD-10-CM | POA: Insufficient documentation

## 2020-10-17 DIAGNOSIS — Z3A15 15 weeks gestation of pregnancy: Secondary | ICD-10-CM | POA: Diagnosis not present

## 2020-10-17 DIAGNOSIS — N3000 Acute cystitis without hematuria: Secondary | ICD-10-CM | POA: Diagnosis not present

## 2020-10-17 HISTORY — DX: Bipolar disorder, unspecified: F31.9

## 2020-10-17 LAB — CBC
HCT: 28 % — ABNORMAL LOW (ref 36.0–46.0)
Hemoglobin: 8.5 g/dL — ABNORMAL LOW (ref 12.0–15.0)
MCH: 23.8 pg — ABNORMAL LOW (ref 26.0–34.0)
MCHC: 30.4 g/dL (ref 30.0–36.0)
Platelets: 306 10*3/uL (ref 150–400)
RBC: 3.57 MIL/uL — ABNORMAL LOW (ref 3.87–5.11)
RDW: 20.6 % — ABNORMAL HIGH (ref 11.5–15.5)
WBC: 10.5 10*3/uL (ref 4.0–10.5)
nRBC: 0 % (ref 0.0–0.2)

## 2020-10-17 LAB — URINALYSIS, COMPLETE (UACMP) WITH MICROSCOPIC
Glucose, UA: NEGATIVE mg/dL
Ketones, ur: 5 mg/dL — AB
Nitrite: NEGATIVE
Specific Gravity, Urine: 1.026 (ref 1.005–1.030)
WBC, UA: >50 WBC/hpf — ABNORMAL HIGH (ref 0–5)
pH: 5 (ref 5.0–8.0)

## 2020-10-17 LAB — COMPREHENSIVE METABOLIC PANEL
ALT: 13 U/L (ref 0–44)
AST: 19 U/L (ref 15–41)
Albumin: 3.6 g/dL (ref 3.5–5.0)
Alkaline Phosphatase: 75 U/L (ref 38–126)
Anion gap: 9 (ref 5–15)
BUN: 6 mg/dL (ref 6–20)
CO2: 22 mmol/L (ref 22–32)
Calcium: 8.9 mg/dL (ref 8.9–10.3)
Chloride: 104 mmol/L (ref 98–111)
Creatinine, Ser: 0.53 mg/dL (ref 0.44–1.00)
GFR, Estimated: >60 mL/min (ref >60)
Potassium: 3.9 mmol/L (ref 3.5–5.1)
Sodium: 135 mmol/L (ref 135–145)
Total Bilirubin: 0.4 mg/dL (ref 0.3–1.2)
Total Protein: 7.4 g/dL (ref 6.5–8.1)

## 2020-10-17 MED ORDER — CEPHALEXIN 500 MG PO CAPS
500.0000 mg | ORAL_CAPSULE | Freq: Once | ORAL | Status: AC
  Administered 2020-10-17: 500 mg via ORAL
  Filled 2020-10-17: qty 1

## 2020-10-17 MED ORDER — CEPHALEXIN 500 MG PO CAPS: 500.0000 mg | ORAL_CAPSULE | Freq: Two times a day (BID) | ORAL | 0 refills | Status: AC

## 2020-10-17 NOTE — ED Triage Notes (Signed)
Pt states is sixteen weeks pregnant and having lower abd pain radiating to low back that feels like contractions. EDD 04/03/2021. Pt denies bleeding.

## 2020-10-17 NOTE — ED Provider Notes (Addendum)
[facility] Emergency Department Provider Note   ____________________________________________   Event Date/Time   First MD Initiated Contact with Patient 10/17/20 2205     (approximate)  I have reviewed the triage vital signs and the nursing notes.   HISTORY  Chief Complaint Abdominal Pain    HPI Elizabeth Zimmerman is a 35 y.o. female, [id number] at approximately 16 weeks of pregnancy with past medical history of bipolar disorder who presents to the ED complaining of abdominal pain.  Patient reports that earlier this evening she developed crampy pain in her lower abdomen along with her lower back.  Pain was waxing and waning in severity, felt similar to contractions with previous pregnancies.  Pain has since resolved and she is feeling much better.  She denies any fevers, dysuria, hematuria, flank pain, nausea, vomiting, or changes in bowel movements.  She has not had any vaginal bleeding or discharge.  She currently [facility] and denies any complications with this pregnancy thus far, although she had 1 miscarriage in the second trimester previously.        Past Medical History:  Diagnosis Date  . Bipolar 1 disorder (HCC)     There are no problems to display for this patient.   History reviewed. No pertinent surgical history.  Prior to Admission medications   Medication Sig Start Date End Date Taking? Authorizing Provider  cephALEXin (KEFLEX) 500 MG capsule Take 1 capsule (500 mg total) by mouth 2 (two) times daily for 7 days. 10/17/20 10/24/20 Yes Chesley Noon, MD    Allergies Patient has no allergy information on record.  No family history on file.  Social History    Review of Systems  Constitutional: No fever/chills Eyes: No visual changes. ENT: No sore throat. Cardiovascular: Denies chest pain. Respiratory: Denies shortness of breath. Gastrointestinal: Positive for abdominal pain.  No nausea, no vomiting.  No diarrhea.   No constipation. Genitourinary: Negative for dysuria. Musculoskeletal: Negative for back pain. Skin: Negative for rash. Neurological: Negative for headaches, focal weakness or numbness.  ____________________________________________   PHYSICAL EXAM:  VITAL SIGNS: ED Triage Vitals  Enc Vitals Group     BP 10/17/20 2040 140/85     Pulse Rate 10/17/20 2040 (!) 105     Resp 10/17/20 2040 18     Temp 10/17/20 2040 98.6 F (37 C)     Temp Source 10/17/20 2040 Oral     SpO2 10/17/20 2040 100 %     Weight 10/17/20 2041 227 lb (103 kg)     Height 10/17/20 2041 5\' 2"  (1.575 m)     Head Circumference --      Peak Flow --      Pain Score 10/17/20 2040 6     Pain Loc --      Pain Edu? --      Excl. in GC? --     Constitutional: Alert and oriented. Eyes: Conjunctivae are normal. Head: Atraumatic. Nose: No congestion/rhinnorhea. Mouth/Throat: Mucous membranes are moist. Neck: Normal ROM Cardiovascular: Normal rate, regular rhythm. Grossly normal heart sounds. Respiratory: Normal respiratory effort.  No retractions. Lungs CTAB. Gastrointestinal: Gravid abdomen, soft and nontender. No distention. Genitourinary: deferred Musculoskeletal: No lower extremity tenderness nor edema. Neurologic:  Normal speech and language. No gross focal neurologic deficits are appreciated. Skin:  Skin is warm, dry and intact. No rash noted. Psychiatric: Mood and affect are normal. Speech and behavior are normal.  ____________________________________________   LABS (all labs ordered are listed, but  only abnormal results are displayed)  Labs Reviewed  COMPREHENSIVE METABOLIC PANEL - Abnormal; Notable for the following components:      Result Value   Glucose, Bld 116 (*)    All other components within normal limits  CBC - Abnormal; Notable for the following components:   RBC 3.57 (*)    Hemoglobin 8.5 (*)    HCT 28.0 (*)    MCV 78.4 (*)    MCH 23.8 (*)    RDW 20.6 (*)    All other components  within normal limits  URINALYSIS, COMPLETE (UACMP) WITH MICROSCOPIC - Abnormal; Notable for the following components:   Color, Urine AMBER (*)    APPearance CLOUDY (*)    Ketones, ur 5 (*)    Protein, ur 100 (*)    Leukocytes,Ua MODERATE (*)    WBC, UA >50 (*)    Bacteria, UA MANY (*)    All other components within normal limits  HCG, QUANTITATIVE, PREGNANCY - Abnormal; Notable for the following components:   hCG, Beta Chain, Quant, S 62,349 (*)    All other components within normal limits  URINE CULTURE    PROCEDURES  Procedure(s) performed (including Critical Care):  Procedures   ____________________________________________   INITIAL IMPRESSION / ASSESSMENT AND PLAN / ED COURSE       35 year old female, G6 P4-0-1-4 at approximately 15 weeks of pregnancy, who presents to the ED with intermittent crampy lower abdominal pain earlier this evening feeling similar to contractions.  Pain has now resolved and ultrasound is reassuring, shows viable intrauterine pregnancy with appropriate fetal heart tones.  Labs are remarkable for anemia, which appears to be a chronic issue and similar to previous labs. UA does show evidence for infection.  We will start patient on Keflex and she is appropriate for discharge home with close OB/GYN follow-up at this time.  She was counseled to return to the ED for new or worsening symptoms.  Patient agrees with plan.      ____________________________________________   FINAL CLINICAL IMPRESSION(S) / ED DIAGNOSES  Final diagnoses:  Abdominal pain during pregnancy, antepartum  Acute cystitis without hematuria     ED Discharge Orders         Ordered    cephALEXin (KEFLEX) 500 MG capsule  2 times daily        10/17/20 2306           Note:  This document was prepared using Dragon voice recognition software and may include unintentional dictation errors.   Chesley Noon, MD 10/17/20 9798    Chesley Noon, MD 10/17/20 [phone]

## 2020-10-17 NOTE — ED Notes (Signed)
Patient transported to Ultrasound °

## 2020-10-19 LAB — URINE CULTURE

## 2020-11-12 ENCOUNTER — Other Ambulatory Visit: Payer: Self-pay

## 2020-11-12 ENCOUNTER — Emergency Department: Payer: Medicaid Other

## 2020-11-12 ENCOUNTER — Emergency Department
Admission: EM | Admit: 2020-11-12 | Discharge: 2020-11-12 | Disposition: A | Discharge status: Home / Self Care | Payer: Medicaid Other | Attending: Emergency Medicine | Admitting: Emergency Medicine

## 2020-11-12 DIAGNOSIS — O9A212 Injury, poisoning and certain other consequences of external causes complicating pregnancy, second trimester: Secondary | ICD-10-CM | POA: Insufficient documentation

## 2020-11-12 DIAGNOSIS — R1030 Lower abdominal pain, unspecified: Secondary | ICD-10-CM | POA: Insufficient documentation

## 2020-11-12 DIAGNOSIS — O9A219 Injury, poisoning and certain other consequences of external causes complicating pregnancy, unspecified trimester: Secondary | ICD-10-CM

## 2020-11-12 DIAGNOSIS — Z3A19 19 weeks gestation of pregnancy: Secondary | ICD-10-CM | POA: Diagnosis not present

## 2020-11-12 DIAGNOSIS — Y999 Unspecified external cause status: Secondary | ICD-10-CM | POA: Diagnosis not present

## 2020-11-12 DIAGNOSIS — Y9289 Other specified places as the place of occurrence of the external cause: Secondary | ICD-10-CM | POA: Diagnosis not present

## 2020-11-12 DIAGNOSIS — W108XXA Fall (on) (from) other stairs and steps, initial encounter: Secondary | ICD-10-CM | POA: Diagnosis not present

## 2020-11-12 DIAGNOSIS — Y9389 Activity, other specified: Secondary | ICD-10-CM | POA: Insufficient documentation

## 2020-11-12 DIAGNOSIS — W19XXXA Unspecified fall, initial encounter: Secondary | ICD-10-CM

## 2020-11-12 MED ORDER — ACETAMINOPHEN 500 MG PO TABS
1000.0000 mg | ORAL_TABLET | Freq: Once | ORAL | Status: AC
  Administered 2020-11-12: 1000 mg via ORAL
  Filled 2020-11-12: qty 2

## 2020-11-12 NOTE — ED Triage Notes (Signed)
Pt states at 6pm she was going up stairs, got lightheaded and fell, hitting her stomach. Pt states she is [redacted] weeks pregnant. Pt denies hitting head or LOC.  Pt states pain to the abdomen and back.  Pt states she does not feel the baby moving around.  FHT in triage was 132bpm

## 2020-11-12 NOTE — ED Provider Notes (Signed)
[facility] Emergency Department Provider Note ____________________________________________   Event Date/Time   First MD Initiated Contact with Patient 11/12/20 2048     (approximate)  I have reviewed the triage vital signs and the nursing notes.  HISTORY  Chief Complaint Fall   HPI Elizabeth Zimmerman is a 35 y.o. femalewho presents to the ED for evaluation of abd pain after a fall.   Chart review indicates hx of bipolar disorder.  Patient is a G6 P3 A1 currently at about [redacted] weeks gestation.  She is Rh+.  Patient presents to the ED for evaluation of an accidental fall and associated lower abdominal cramping sensation.  She presents with her sister, who advised additional history.  Patient reports being in her typical state of health until tripping and falling while going up some steps, causing her to fall forward and strike her abdomen directly onto brick steps.  This occurred about 2 hours prior to arrival.  She reports skinning her left elbow, but no additional trauma.  No head trauma.  No syncope.  She is not taken any medications for pain.  She reports 4/10 intensity lower abdominal cramping pain and concern for her fetus.  She has had no vaginal bleeding.  Past Medical History:  Diagnosis Date  . Bipolar 1 disorder (HCC)   . Miscarriage 2017    Patient Active Problem List   Diagnosis Date Noted  . Labor and delivery, indication for care 02/28/2015    Past Surgical History:  Procedure Laterality Date  . CESAREAN SECTION      Prior to Admission medications   Medication Sig Start Date End Date Taking? Authorizing Provider  citalopram (CELEXA) 20 MG tablet Take 20 mg by mouth daily.    [provider]  ferrous gluconate (FERGON) 324 MG tablet Take 1 tablet (324 mg total) by mouth daily with breakfast. Be very careful this medicine is poisonous for children Patient not taking: Reported on 03/05/2018 04/14/17   Arnaldo Natal, MD  prenatal  vitamin w/FE, FA (PRENATAL 1 + 1) 27-1 MG TABS tablet Take 1 tablet by mouth daily at 12 noon.    [provider]    Allergies Patient has no known allergies.  No family history on file.  Social History Social History   Tobacco Use  . Smoking status: Never Smoker  . Smokeless tobacco: Never Used  Vaping Use  . Vaping Use: Never used  Substance Use Topics  . Alcohol use: No  . Drug use: No    Review of Systems  Constitutional: No fever/chills Eyes: No visual changes. ENT: No sore throat. Cardiovascular: Denies chest pain. Respiratory: Denies shortness of breath. Gastrointestinal: Positive for abdominal pain. No nausea, no vomiting.  No diarrhea.  No constipation. Genitourinary: Negative for dysuria. Musculoskeletal: Negative for back pain. Skin: Negative for rash. Neurological: Negative for headaches, focal weakness or numbness.  ____________________________________________   PHYSICAL EXAM:  VITAL SIGNS: Vitals:   11/12/20 2037 11/12/20 2256  BP: 116/71 (!) 97/58  Pulse: 84 90  Resp: 18 18  Temp: 98.6 F (37 C)   SpO2: 100% 100%     Constitutional: Alert and oriented. Well appearing and in no acute distress.  Obese Eyes: Conjunctivae are normal. PERRL. EOMI. Head: Atraumatic. Nose: No congestion/rhinnorhea. Mouth/Throat: Mucous membranes are moist.  Oropharynx non-erythematous. Neck: No stridor. No cervical spine tenderness to palpation. Cardiovascular: Normal rate, regular rhythm. Grossly normal heart sounds.  Good peripheral circulation. Respiratory: Normal respiratory effort.  No retractions.  Lungs CTAB. Gastrointestinal: Soft , nondistended. No CVA tenderness. Gravid No upper abdominal or fundal tenderness to palpation.  Minimal suprapubic tenderness without peritoneal features. Musculoskeletal: No lower extremity tenderness nor edema.  No joint effusions.  Superficial abrasion to the ulnar aspect of her mid right forearm without discrete  laceration to necessitate repair.  Distally neurovascularly intact.  Palpation of ulna and bony anatomy is without bony tenderness or indications for imaging. Neurologic:  Normal speech and language. No gross focal neurologic deficits are appreciated. No gait instability noted. Skin:  Skin is warm, dry and intact. No rash noted. Psychiatric: Mood and affect are normal. Speech and behavior are normal. ____________________________________________  RADIOLOGY  ED MD interpretation:  Limited OB U/S reviewed by me without evidence of placental abruption  Official radiology report(s): US OB Limited  Result Date: 11/12/2020 CLINICAL DATA:  Larey Seat downstairs, lower abdominal pain, pregnant EXAM: LIMITED OBSTETRIC ULTRASOUND COMPARISON:  None. FINDINGS: Number of Fetuses: 1 Heart Rate:  148 bpm Movement: Yes Presentation: Transverse Placental Location: Posterior Previa: Marginal, 1.4 cm from placental margin to cervical os Amniotic Fluid (Subjective):  Within normal limits. AFI: 3.4 cm cm BPD: 4.41 cm 19 w  2 d MATERNAL FINDINGS: Cervix:  Appears closed. Uterus/Adnexae: The ovaries are not identified. Uterus is grossly unremarkable. No evidence of placental abruption or hemorrhage. IMPRESSION: 1. Single live intrauterine pregnancy as above estimated age 15 weeks and 2 days. 2. No evidence of placental abruption. 3. Low lying placenta, with the central margin 1.4 cm from internal cervical os. This could be followed on subsequent outpatient ultrasounds. This exam is performed on an emergent basis and does not comprehensively evaluate fetal size, dating, or anatomy; follow-up complete OB US should be considered if further fetal assessment is warranted. Electronically Signed   By: Sharlet Salina M.D.   On: 11/12/2020 23:02   ___________________________________________   PROCEDURES and INTERVENTIONS  Procedure(s) performed (including Critical Care):  Procedures  Medications  acetaminophen (TYLENOL) tablet  1,000 mg (1,000 mg Oral Given 11/12/20 2123)    ____________________________________________   MDM / ED COURSE   35 year old female at about [redacted] weeks gestation presents after an accidental mechanical fall causing her to strike her abdomen on a set of steps, without evidence of maternal or fetal pathology, and amenable to outpatient management.  Normal vitals on room air.  Exam with mild and poorly localizing lower abdominal tenderness without peritoneal features.  Uterine fundus is nontender and her abdomen is otherwise nontender.  She has an abrasion to her left forearm from the fall, but no other evidence of extremity trauma.  No bony tenderness to necessitate imaging as it appears to be a soft tissue injury.  She looks well overall clinically.  Resolving pain after Tylenol.  Obstetric ultrasound without evidence of placental abruption, subchorionic hemorrhage or any traumatic pathology.  We will discharge with return precautions and plans to follow-up with OB/GYN.  Clinical Course as of 11/12/20 2313  Thu Nov 12, 2020  2240 Went to reassess, patient is at ultrasound. [DS]  2301 Reassessed.  Patient reports improving pain after Tylenol.  Awaiting ultrasound read. [DS]  2312 Educated patient of reassuring ultrasound and she expresses relief.  Reexamination of her abdomen reveals benign exam.  We discussed Tylenol at home and following up with OB/GYN.  We discussed return precautions to the ED. [DS]    Clinical Course User Index [DS] Delton Prairie, MD    ____________________________________________   FINAL CLINICAL IMPRESSION(S) / ED DIAGNOSES  Final diagnoses:  Fall, initial encounter  [redacted] weeks gestation of pregnancy     ED Discharge Orders    None       Shenee Wignall Katrinka Blazing   Note:  This document was prepared using Sales executive software and may include unintentional dictation errors.   Delton Prairie, MD 11/12/20 2325

## 2020-11-12 NOTE — Discharge Instructions (Signed)
Use Tylenol for pain and fevers.  Up to 1000 mg per dose, up to 4 times per day.  Do not take more than 4000 mg of Tylenol/acetaminophen within 24 hours..  Thankfully, baby looks good on ultrasound.  Continue your normal care at home and follow-up with your OB/GYN.  Return to the ED with any worsening lower abdominal cramping or pain, or any vaginal bleeding.

## 2020-11-12 NOTE — ED Triage Notes (Signed)
Patient ambulatory to triage with steady gait, without difficulty or distress noted; pt reports PTA falling forward onto stomach; 19wks 5day pregnant; G6P4, pt at [facility]; [facility] 8/6; reports lower abd pain and no movement since fall; denies any leaking or bleeding at this time

## 2020-11-12 NOTE — ED Notes (Signed)
Patient at US

## 2021-11-08 DIAGNOSIS — I890 Lymphedema, not elsewhere classified: Secondary | ICD-10-CM | POA: Insufficient documentation

## 2023-07-20 DIAGNOSIS — R7303 Prediabetes: Secondary | ICD-10-CM | POA: Insufficient documentation

## 2023-08-30 DIAGNOSIS — E119 Type 2 diabetes mellitus without complications: Secondary | ICD-10-CM

## 2023-08-30 HISTORY — DX: Type 2 diabetes mellitus without complications: E11.9

## 2024-04-04 ENCOUNTER — Emergency Department
Admission: EM | Admit: 2024-04-04 | Discharge: 2024-04-04 | Disposition: A | Discharge status: Home / Self Care | Attending: Emergency Medicine | Admitting: Emergency Medicine

## 2024-04-04 ENCOUNTER — Other Ambulatory Visit: Payer: Self-pay

## 2024-04-04 ENCOUNTER — Encounter: Payer: Self-pay | Admitting: Emergency Medicine

## 2024-04-04 ENCOUNTER — Emergency Department

## 2024-04-04 DIAGNOSIS — J189 Pneumonia, unspecified organism: Secondary | ICD-10-CM | POA: Diagnosis not present

## 2024-04-04 DIAGNOSIS — R0602 Shortness of breath: Secondary | ICD-10-CM | POA: Diagnosis present

## 2024-04-04 DIAGNOSIS — Z853 Personal history of malignant neoplasm of breast: Secondary | ICD-10-CM | POA: Diagnosis not present

## 2024-04-04 DIAGNOSIS — J029 Acute pharyngitis, unspecified: Secondary | ICD-10-CM | POA: Insufficient documentation

## 2024-04-04 LAB — RESP PANEL BY RT-PCR (RSV, FLU A&B, COVID)  RVPGX2
Influenza A by PCR: NEGATIVE
Influenza B by PCR: NEGATIVE
Resp Syncytial Virus by PCR: NEGATIVE
SARS Coronavirus 2 by RT PCR: NEGATIVE

## 2024-04-04 LAB — GROUP A STREP BY PCR: Group A Strep by PCR: NOT DETECTED

## 2024-04-04 MED ORDER — AZITHROMYCIN 250 MG PO TABS: ORAL_TABLET | ORAL | 0 refills | Status: DC

## 2024-04-04 MED ORDER — CEFDINIR 300 MG PO CAPS: 300.0000 mg | ORAL_CAPSULE | Freq: Two times a day (BID) | ORAL | 0 refills | Status: DC

## 2024-04-04 MED ORDER — IBUPROFEN 800 MG PO TABS
800.0000 mg | ORAL_TABLET | Freq: Once | ORAL | Status: AC
  Administered 2024-04-04: 800 mg via ORAL
  Filled 2024-04-04: qty 1

## 2024-04-04 MED ORDER — AZITHROMYCIN 250 MG PO TABS: ORAL_TABLET | ORAL | 0 refills | Status: AC

## 2024-04-04 MED ORDER — DEXAMETHASONE 6 MG PO TABS
10.0000 mg | ORAL_TABLET | Freq: Once | ORAL | Status: AC
  Administered 2024-04-04: 10 mg via ORAL
  Filled 2024-04-04: qty 1

## 2024-04-04 NOTE — ED Provider Notes (Signed)
 [facility] Provider Note    Event Date/Time   First MD Initiated Contact with Patient 04/04/24 [phone]     (approximate)   History   Sore Throat   HPI  Elizabeth Zimmerman is a 38 y.o. female with history of breast cancer status post total mastectomy, radiation, chemotherapy and HER2 targeted therapy now on Lupron, bipolar disorder, obesity who presents to the emergency department with complaints of cough, shortness of breath, sore throat today.  No fevers.  No vomiting or diarrhea.  Has 4 children but no known sick contacts.  States she felt like her uvula was swollen this morning and she had a hard time breathing.  No lip or tongue swelling.  States she has been able to eat and drink normally.   History provided by patient.    Past Medical History:  Diagnosis Date   Bipolar 1 disorder [facility])    Miscarriage 2017    Past Surgical History:  Procedure Laterality Date   CESAREAN SECTION      MEDICATIONS:  Prior to Admission medications   Medication Sig Start Date End Date Taking? Authorizing Provider  citalopram (CELEXA) 20 MG tablet Take 20 mg by mouth daily.    [provider]  ferrous gluconate  (FERGON) 324 MG tablet Take 1 tablet (324 mg total) by mouth daily with breakfast. Be very careful this medicine is poisonous for children Patient not taking: Reported on 03/05/2018 04/14/17   Malinda, Paul F, MD  prenatal vitamin w/FE, FA (PRENATAL 1 + 1) 27-1 MG TABS tablet Take 1 tablet by mouth daily at 12 noon.    [provider]    Physical Exam   Triage Vital Signs: ED Triage Vitals  Encounter Vitals Group     BP 04/04/24 0556 (!) 153/109     Girls Systolic BP Percentile --      Girls Diastolic BP Percentile --      Boys Systolic BP Percentile --      Boys Diastolic BP Percentile --      Pulse Rate 04/04/24 0557 90     Resp 04/04/24 0557 18     Temp 04/04/24 0557 98.3 F (36.8 C)     Temp Source 04/04/24 0557 Oral     SpO2  04/04/24 0557 100 %     Weight 04/04/24 0554 248 lb (112.5 kg)     Height 04/04/24 0554 5' 2 (1.575 m)     Head Circumference --      Peak Flow --      Pain Score --      Pain Loc --      Pain Education --      Exclude from Growth Chart --     Most recent vital signs: Vitals:   04/04/24 0557 04/04/24 0608  BP: (!) 153/109   Pulse: 90   Resp: 18   Temp: 98.3 F (36.8 C) 98.4 F (36.9 C)  SpO2: 100%     CONSTITUTIONAL: Alert, responds appropriately to questions. Well-appearing; well-nourished HEAD: Normocephalic, atraumatic EYES: Conjunctivae clear, pupils appear equal, sclera nonicteric ENT: normal nose; moist mucous membranes; No pharyngeal erythema or petechiae, no tonsillar hypertrophy or exudate, no uvular deviation, uvula does not appear swollen on my exam, no unilateral swelling in posterior oropharynx, no trismus or drooling, no muffled voice, normal phonation, no stridor, airway patent.  No angioedema. NECK: Supple, normal ROM CARD: RRR; S1 and S2 appreciated RESP: Normal chest excursion without splinting or tachypnea; breath sounds  clear and equal bilaterally; no wheezes, no rhonchi, no rales, no hypoxia or respiratory distress, speaking full sentences ABD/GI: Non-distended; soft, non-tender, no rebound, no guarding, no peritoneal signs BACK: The back appears normal EXT: Normal ROM in all joints; no deformity noted, no edema SKIN: Normal color for age and race; warm; no rash on exposed skin NEURO: Moves all extremities equally, normal speech PSYCH: The patient's mood and manner are appropriate.   ED Results / Procedures / Treatments   LABS: (all labs ordered are listed, but only abnormal results are displayed) Labs Reviewed  GROUP A STREP BY PCR  RESP PANEL BY RT-PCR (RSV, FLU A&B, COVID)  [id number]     EKG:   RADIOLOGY: My personal review and interpretation of imaging: Chest x-ray concerning for bibasilar pneumonia.  I have personally reviewed all  radiology reports.   DG Chest Portable 1 View Result Date: 04/04/2024 CLINICAL DATA:  Cough and shortness of breath.  Sore throat. EXAM: PORTABLE CHEST 1 VIEW COMPARISON:  None Available. FINDINGS: Low volume film accentuates the cardiopericardial silhouette. Cardiopericardial silhouette is at upper limits of normal for size. Streaky density in the lung bases suggests atelectasis although a component of pneumonia in either lung base is difficult to exclude given superimposition of hazy overlying soft tissue density. No substantial pleural effusion. No pulmonary edema. No acute bony abnormality. IMPRESSION: Low volume film with streaky bibasilar atelectasis. A component of pneumonia in either lung base is difficult to exclude given superimposition of hazy overlying soft tissue density. Dedicated upright PA and lateral chest x-ray, when patient is able, may prove helpful to further evaluate. Electronically Signed   By: Camellia Candle M.D.   On: 04/04/2024 06:27     PROCEDURES:  Critical Care performed: No   Procedures    IMPRESSION / MDM / ASSESSMENT AND PLAN / ED COURSE  I reviewed the triage vital signs and the nursing notes.    Patient here with sore throat, difficulty breathing, feeling like her uvula was swollen.     DIFFERENTIAL DIAGNOSIS (includes but not limited to):   Viral URI, pneumonia, uvulitis, pharyngitis, no signs clinically of tonsillitis, doubt deep space neck infection or PTA.  Does not appear to be an allergic reaction.   Patient's presentation is most consistent with acute complicated illness / injury requiring diagnostic workup.   PLAN: Will obtain COVID, flu, RSV and strep swabs.  Will obtain chest x-ray.  Will give ibuprofen , Decadron  for symptomatic relief.   MEDICATIONS GIVEN IN ED: Medications  ibuprofen  (ADVIL ) tablet 800 mg (800 mg Oral Given 04/04/24 0605)  dexamethasone  (DECADRON ) tablet 10 mg (10 mg Oral Given 04/04/24 9386)     ED COURSE: Chest x-ray  reviewed and interpreted by myself and the radiologist and is concerning for bibasilar pneumonia.  Given her cough and shortness of breath, clinically I feel she likely does have pneumonia.  Will start her on cefdinir  and azithromycin  for coverage for community-acquired pneumonia.  Her strep test today is negative.  COVID, flu and RSV negative.  Continues to be hemodynamically stable without hypoxia or increased work of breathing.  I feel she is safe for outpatient management.  Discussed supportive care instructions and return precautions.   At this time, I do not feel there is any life-threatening condition present. I reviewed all nursing notes, vitals, pertinent previous records.  All lab and urine results, EKGs, imaging ordered have been independently reviewed and interpreted by myself.  I reviewed all available radiology reports from any imaging  ordered this visit.  Based on my assessment, I feel the patient is safe to be discharged home without further emergent workup and can continue workup as an outpatient as needed. Discussed all findings, treatment plan as well as usual and customary return precautions.  They verbalize understanding and are comfortable with this plan.  Outpatient follow-up has been provided as needed.  All questions have been answered.   CONSULTS:  none   OUTSIDE RECORDS REVIEWED: Reviewed last oncology note on 03/11/2024.       FINAL CLINICAL IMPRESSION(S) / ED DIAGNOSES   Final diagnoses:  Pharyngitis, unspecified etiology  Community acquired pneumonia, unspecified laterality     Rx / DC Orders   ED Discharge Orders          Ordered    cefdinir  (OMNICEF ) 300 MG capsule  2 times daily        04/04/24 0642    azithromycin  (ZITHROMAX  Z-PAK) 250 MG tablet        04/04/24 9357             Note:  This document was prepared using Dragon voice recognition software and may include unintentional dictation errors.   Tashae Inda, Josette SAILOR, [place] 04/04/24 [phone]

## 2024-04-04 NOTE — Discharge Instructions (Addendum)
 You may alternate over the counter Tylenol 1000 mg every 6 hours as needed for pain, fever and Ibuprofen 800 mg every 6-8 hours as needed for pain, fever.  Please take Ibuprofen with food.  Do not take more than 4000 mg of Tylenol (acetaminophen) in a 24 hour period.

## 2024-04-04 NOTE — ED Triage Notes (Signed)
 Patient ambulatory to triage with steady gait, without difficulty or distress noted; pt reports sore throat this morning with no accomp symptoms

## 2024-05-07 ENCOUNTER — Ambulatory Visit: Admitting: Nurse Practitioner

## 2024-05-07 ENCOUNTER — Encounter: Payer: Self-pay | Admitting: Nurse Practitioner

## 2024-05-07 VITALS — BP 140/92 | HR 96 | Temp 98.2°F | Resp 18 | Ht 62.0 in | Wt 260.7 lb

## 2024-05-07 DIAGNOSIS — Z131 Encounter for screening for diabetes mellitus: Secondary | ICD-10-CM

## 2024-05-07 DIAGNOSIS — Z6841 Body Mass Index (BMI) 40.0 and over, adult: Secondary | ICD-10-CM | POA: Diagnosis not present

## 2024-05-07 DIAGNOSIS — E66813 Obesity, class 3: Secondary | ICD-10-CM | POA: Diagnosis not present

## 2024-05-07 DIAGNOSIS — F319 Bipolar disorder, unspecified: Secondary | ICD-10-CM

## 2024-05-07 DIAGNOSIS — H539 Unspecified visual disturbance: Secondary | ICD-10-CM | POA: Diagnosis not present

## 2024-05-07 DIAGNOSIS — Z599 Problem related to housing and economic circumstances, unspecified: Secondary | ICD-10-CM

## 2024-05-07 DIAGNOSIS — Z114 Encounter for screening for human immunodeficiency virus [HIV]: Secondary | ICD-10-CM

## 2024-05-07 DIAGNOSIS — R29818 Other symptoms and signs involving the nervous system: Secondary | ICD-10-CM

## 2024-05-07 DIAGNOSIS — Z13220 Encounter for screening for lipoid disorders: Secondary | ICD-10-CM

## 2024-05-07 DIAGNOSIS — Z1731 Human epidermal growth factor receptor 2 positive status: Secondary | ICD-10-CM

## 2024-05-07 DIAGNOSIS — C50912 Malignant neoplasm of unspecified site of left female breast: Secondary | ICD-10-CM | POA: Diagnosis not present

## 2024-05-07 DIAGNOSIS — R030 Elevated blood-pressure reading, without diagnosis of hypertension: Secondary | ICD-10-CM

## 2024-05-07 DIAGNOSIS — Z7689 Persons encountering health services in other specified circumstances: Secondary | ICD-10-CM

## 2024-05-07 DIAGNOSIS — Z1159 Encounter for screening for other viral diseases: Secondary | ICD-10-CM

## 2024-05-07 NOTE — Progress Notes (Signed)
 BP (!) 140/92   Pulse 96   Temp 98.2 F (36.8 C)   Resp 18   Ht 5' 2 (1.575 m)   Wt 260 lb 11.2 oz (118.3 kg)   SpO2 97%   BMI 47.68 kg/m    Subjective:    Patient ID: Elizabeth Zimmerman, female    DOB: 02/22/1986, 38 y.o.   MRN: [id number]  HPI: Elizabeth Zimmerman is a 38 y.o. female  Chief Complaint  Patient presents with   Establish Care   Obesity    Discuss meds, get labs   Discussed the use of AI scribe software for clinical note transcription with the patient, who gave verbal consent to proceed.  History of Present Illness [name] is a 37 year old female with a history of left breast cancer and bipolar disorder who presents to establish care.  Breast cancer and neurological symptoms - History of left breast cancer, HER2 positive, managed by oncology at [facility] - Completed chemotherapy and currently receiving Lupron therapy - Last oncology visit on March 11, 2024 - Experiencing headaches, nausea, and blurred vision - Brain MRI scheduled for end of September 2025  Mood disturbance - History of bipolar disorder, under psychiatric care - Previously treated with Depakote and Celexa - Discontinued Abilify approximately 1.5 months ago due to running out and adverse side effects - Currently not taking any psychiatric medications  Sleep-disordered breathing - Experiencing occasional apneas at night - No prior sleep study performed  Obesity and metabolic status - History of obesity with current weight of 260 pounds and BMI of 47.68 - Last hemoglobin A1c in April 2025 was 6.3 Waist Measurement : 56.5 inches   Blood pressure variability - Blood pressure fluctuates - Does not regularly monitor blood pressure at home         05/07/2024    8:46 AM  Depression screen PHQ 2/9  Decreased Interest 3  Down, Depressed, Hopeless 3  PHQ - 2 Score 6  Altered sleeping 3  Tired, decreased energy 3  Change in appetite 3  Feeling bad or failure about yourself  3   Trouble concentrating 3  Moving slowly or fidgety/restless 0  Suicidal thoughts 0  PHQ-9 Score 21  Difficult doing work/chores Somewhat difficult    Relevant past medical, surgical, family and social history reviewed and updated as indicated. Interim medical history since our last visit reviewed. Allergies and medications reviewed and updated.  Review of Systems  Ten systems reviewed and is negative except as mentioned in HPI      Objective:      BP (!) 140/92   Pulse 96   Temp 98.2 F (36.8 C)   Resp 18   Ht 5' 2 (1.575 m)   Wt 260 lb 11.2 oz (118.3 kg)   SpO2 97%   BMI 47.68 kg/m    Wt Readings from Last 3 Encounters:  05/07/24 260 lb 11.2 oz (118.3 kg)  04/04/24 248 lb (112.5 kg)  11/12/20 236 lb (107 kg)    Physical Exam VITALS: BP- 136/90 MEASUREMENTS: Weight- 260, BMI- 47.68. GENERAL: Alert, cooperative, well developed, no acute distress. HEENT: Normocephalic, normal oropharynx, moist mucous membranes. CHEST: Clear to auscultation bilaterally, no wheezes, rhonchi, or crackles. CARDIOVASCULAR: Normal heart rate and rhythm, S1 and S2 normal without murmurs. ABDOMEN: Soft, non-tender, non-distended, without organomegaly, normal bowel sounds. EXTREMITIES: No cyanosis or edema. NEUROLOGICAL: Cranial nerves grossly intact, moves all extremities without gross motor or sensory deficit.  Vision Screening  Right eye Left eye Both eyes  Without correction 20/25 20/25 20/20   With correction              Assessment & Plan:   Problem List Items Addressed This Visit       Other   HER2-positive carcinoma of left breast (HCC)   Relevant Medications   leuprolide (LUPRON) 11.25 MG injection   Bipolar 1 disorder (HCC)   Class 3 severe obesity due to excess calories with serious comorbidity and body mass index (BMI) of 40.0 to 44.9 in adult - Primary   Relevant Orders   TSH   Other Visit Diagnoses       Financial difficulties       Relevant Orders   AMB  Referral VBCI Care Management     Encounter to establish care         Suspected sleep apnea       Relevant Orders   Ambulatory referral to Pulmonology     Elevated blood pressure reading       Relevant Orders   CBC with Differential/Platelet   Comprehensive metabolic panel with GFR     Screening for HIV without presence of risk factors       Relevant Orders   HIV Antibody (routine testing w rflx)     Encounter for hepatitis C screening test for low risk patient       Relevant Orders   Hepatitis C antibody     Screening for diabetes mellitus       Relevant Orders   Comprehensive metabolic panel with GFR   Hemoglobin A1c     Screening for cholesterol level       Relevant Orders   Lipid panel     Changes in vision       Relevant Orders   Ambulatory referral to Optometry   Visual acuity screening        Assessment and Plan Assessment & Plan HER2 positive left breast cancer, on hormonal therapy, post-chemotherapy HER2 positive carcinoma of the left breast, managed by oncology at [facility]. She is on Lupron and has completed chemotherapy. A brain MRI was ordered due to headaches, nausea, and blurred vision, scheduled for the end of this month. - Continue Lupron as prescribed by oncology - Ensure completion of scheduled brain MRI  Obesity, class 3 Class 3 obesity with a BMI of 47.68. Discussed weight loss medication options, including GLP-1 medications (Wegovy, ZipFound), Contrave, and phentermine. GLP-1 medications are preferred due to their efficacy in weight loss and potential dual benefit if diabetes is present. Discussed side effects and contraindications, including pancreatitis and thyroid cancer history. GLP-1 medications can result in 20-40 pound weight loss, while Contrave may result in 10-20 pound loss. Phentermine is the cheapest option but requires blood pressure control and psychiatric approval due to its stimulant nature. - Order sleep study to assess for sleep apnea, which  may aid in insurance approval for weight loss medication - Perform lab work including CBC, CMP, A1c, and thyroid function tests to assess for diabetes and other metabolic conditions - Consider GLP-1 medication if diabetes is confirmed  Suspected sleep apnea Reports occasional apneas at night but has not undergone a sleep study. Sleep apnea assessment is important for both health management and potential insurance approval for weight loss medication. - Order sleep study  Elevated blood pressure without diagnosis of hypertension Blood pressure recorded at 136/90, which is elevated. No prior history of hypertension. Blood pressure fluctuates and is not regularly  monitored at home. - Recheck blood pressure before leaving -decrease sodium in diet - If blood pressure remains elevated, schedule follow-up in four weeks  Bipolar disorder Bipolar disorder, previously managed with Abilify, which she has not taken for a month and a half due to adverse effects and running out of medication. Previously on Celexa, Depakote, and trazodone, which she felt were more effective. Follow-up with psychiatry is scheduled in a couple of weeks. - Follow up with psychiatry as scheduled  Unspecified visual disturbance and headache Reports intermittent blurred vision and headaches, possibly related to vision issues. Has not seen an eye doctor due to insurance constraints. Referral to optometry is necessary to evaluate vision and its potential contribution to headaches. - Refer to optometry for comprehensive eye examination        Follow up plan: Return in about 4 weeks (around 06/04/2024) for follow up.

## 2024-05-08 ENCOUNTER — Other Ambulatory Visit: Payer: Self-pay | Admitting: Nurse Practitioner

## 2024-05-08 ENCOUNTER — Ambulatory Visit: Payer: Self-pay | Admitting: Nurse Practitioner

## 2024-05-08 DIAGNOSIS — E1165 Type 2 diabetes mellitus with hyperglycemia: Secondary | ICD-10-CM

## 2024-05-08 LAB — COMPREHENSIVE METABOLIC PANEL WITH GFR
AG Ratio: 1.5 (calc) (ref 1.0–2.5)
ALT: 24 U/L (ref 6–29)
AST: 25 U/L (ref 10–30)
Albumin: 4.4 g/dL (ref 3.6–5.1)
Alkaline phosphatase (APISO): 104 U/L (ref 31–125)
BUN: 7 mg/dL (ref 7–25)
CO2: 31 mmol/L (ref 20–32)
Calcium: 9.6 mg/dL (ref 8.6–10.2)
Chloride: 102 mmol/L (ref 98–110)
Creat: 0.68 mg/dL (ref 0.50–0.97)
Globulin: 3 g/dL (ref 1.9–3.7)
Glucose, Bld: 134 mg/dL — ABNORMAL HIGH (ref 65–99)
Potassium: 3.8 mmol/L (ref 3.5–5.3)
Sodium: 141 mmol/L (ref 135–146)
Total Bilirubin: 0.7 mg/dL (ref 0.2–1.2)
Total Protein: 7.4 g/dL (ref 6.1–8.1)
eGFR: 115 mL/min/1.73m2 (ref >=60)

## 2024-05-08 LAB — CBC WITH DIFFERENTIAL/PLATELET
Absolute Lymphocytes: 2251 {cells}/uL (ref 850–3900)
Absolute Monocytes: 454 {cells}/uL (ref 200–950)
Basophils Absolute: 28 {cells}/uL (ref 0–200)
Basophils Relative: 0.4 %
Eosinophils Absolute: 121 {cells}/uL (ref 15–500)
Eosinophils Relative: 1.7 %
HCT: 39.5 % (ref 35.0–45.0)
Hemoglobin: 13.2 g/dL (ref 11.7–15.5)
MCH: 30.3 pg (ref 27.0–33.0)
MCHC: 33.4 g/dL (ref 32.0–36.0)
MCV: 90.8 fL (ref 80.0–100.0)
MPV: 9.6 fL (ref 7.5–12.5)
Monocytes Relative: 6.4 %
Neutro Abs: 4246 {cells}/uL (ref 1500–7800)
Neutrophils Relative %: 59.8 %
Platelets: 291 Thousand/uL (ref 140–400)
RBC: 4.35 Million/uL (ref 3.80–5.10)
RDW: 12.7 % (ref 11.0–15.0)
Total Lymphocyte: 31.7 %
WBC: 7.1 Thousand/uL (ref 3.8–10.8)

## 2024-05-08 LAB — LIPID PANEL
Cholesterol: 182 mg/dL (ref <200)
HDL: 44 mg/dL — ABNORMAL LOW (ref >=50)
LDL Cholesterol (Calc): 113 mg/dL — ABNORMAL HIGH
Non-HDL Cholesterol (Calc): 138 mg/dL — ABNORMAL HIGH (ref <130)
Total CHOL/HDL Ratio: 4.1 (calc) (ref <5.0)
Triglycerides: 142 mg/dL (ref <150)

## 2024-05-08 LAB — HEPATITIS C ANTIBODY: Hepatitis C Ab: NONREACTIVE

## 2024-05-08 LAB — HEMOGLOBIN A1C
Hgb A1c MFr Bld: 6.7 % — ABNORMAL HIGH (ref <5.7)
Mean Plasma Glucose: 146 mg/dL
eAG (mmol/L): 8.1 mmol/L

## 2024-05-08 LAB — HIV ANTIBODY (ROUTINE TESTING W REFLEX)
HIV 1&2 Ab, 4th Generation: NONREACTIVE
HIV FINAL INTERPRETATION: NEGATIVE

## 2024-05-08 LAB — TSH: TSH: 1.72 m[IU]/L

## 2024-05-08 MED ORDER — OZEMPIC (0.25 OR 0.5 MG/DOSE) 2 MG/3ML ~~LOC~~ SOPN: 0.2500 mg | PEN_INJECTOR | SUBCUTANEOUS | 1 refills | Status: DC

## 2024-05-10 ENCOUNTER — Other Ambulatory Visit: Payer: Self-pay | Admitting: Nurse Practitioner

## 2024-05-10 ENCOUNTER — Telehealth: Payer: Self-pay | Admitting: Pharmacy Technician

## 2024-05-10 ENCOUNTER — Other Ambulatory Visit (HOSPITAL_COMMUNITY): Payer: Self-pay

## 2024-05-10 DIAGNOSIS — E1165 Type 2 diabetes mellitus with hyperglycemia: Secondary | ICD-10-CM

## 2024-05-10 MED ORDER — METFORMIN HCL ER 500 MG PO TB24: 500.0000 mg | ORAL_TABLET | Freq: Every day | ORAL | 1 refills | Status: DC

## 2024-05-10 NOTE — Telephone Encounter (Signed)
 Pharmacy Patient Advocate Encounter   Received notification from CoverMyMeds that prior authorization for Ozempic  (0.25 or 0.5 MG/DOSE) 2MG /3ML pen-injectors is required/requested.   Insurance verification completed.   The patient is insured through [employer] .   Per test claim:  Trial and failure of METFORMIN  is preferred by the insurance.  If suggested medication is appropriate, Please send in a new RX and discontinue this one. If not, please advise as to why it's not appropriate so that we may request a Prior Authorization. Please note, some preferred medications may still require a PA.  If the suggested medications have not been trialed and there are no contraindications to their use, the PA will not be submitted, as it will not be approved.

## 2024-05-10 NOTE — Telephone Encounter (Signed)
 Pt.notified

## 2024-05-13 ENCOUNTER — Ambulatory Visit: Admitting: Sleep Medicine

## 2024-05-13 ENCOUNTER — Telehealth: Payer: Self-pay

## 2024-05-13 ENCOUNTER — Encounter: Payer: Self-pay | Admitting: Sleep Medicine

## 2024-05-13 VITALS — BP 120/80 | HR 71 | Temp 98.1°F | Ht 62.0 in | Wt 262.8 lb

## 2024-05-13 DIAGNOSIS — G4710 Hypersomnia, unspecified: Secondary | ICD-10-CM

## 2024-05-13 DIAGNOSIS — Z6841 Body Mass Index (BMI) 40.0 and over, adult: Secondary | ICD-10-CM | POA: Diagnosis not present

## 2024-05-13 DIAGNOSIS — E1165 Type 2 diabetes mellitus with hyperglycemia: Secondary | ICD-10-CM

## 2024-05-13 DIAGNOSIS — G4733 Obstructive sleep apnea (adult) (pediatric): Secondary | ICD-10-CM

## 2024-05-13 MED ORDER — TIRZEPATIDE 2.5 MG/0.5ML ~~LOC~~ SOAJ: 2.5000 mg | SUBCUTANEOUS | 0 refills | Status: DC

## 2024-05-13 NOTE — Telephone Encounter (Signed)
 Copied from CRM [id number]. Topic: Clinical - Medical Advice >> May 13, 2024 11:21 AM Gustabo D wrote: Pt wants to speak with her pcp about a medication

## 2024-05-13 NOTE — Patient Instructions (Signed)
 SABRA

## 2024-05-13 NOTE — Progress Notes (Signed)
 Name:Elizabeth Zimmerman MRN: [id number] DOB: Dec 24, 1985   CHIEF COMPLAINT:  EXCESSIVE DAYTIME SLEEPINESS   HISTORY OF PRESENT ILLNESS: Ms. Preiss is a 38 y.o. w/ a h/o breast CA, DMII and morbid obesity who presents for c/o loud snoring and excessive daytime sleepiness which has been present for several years. Reports nocturnal awakenings due to dry mouth and nocturia and has difficulty falling back to sleep. Denies any significant weight changes. Admits to dry mouth. Denies morning headaches, RLS symptoms, dream enactment, cataplexy, hypnagogic or hypnapompic hallucinations. Reports a family history of sleep apnea. Denies drowsy driving. Drinks soda occasionally, denies alcohol, tobacco or illicit drug use.   Bedtime 10 pm-2 am Sleep onset 30 mins Rise time 7 am   EPWORTH SLEEP SCORE 12    05/13/2024    9:00 AM  Results of the Epworth flowsheet  Sitting and reading 3  Watching TV 3  Sitting, inactive in a public place (e.g. a theatre or a meeting) 1  As a passenger in a car for an hour without a break 3  Lying down to rest in the afternoon when circumstances permit 2  Sitting and talking to someone 0  Sitting quietly after a lunch without alcohol 0  In a car, while stopped for a few minutes in traffic 0  Total score 12    PAST MEDICAL HISTORY :   has a past medical history of Anxiety, Bipolar 1 disorder (HCC), Depression, breast cancer, Miscarriage (2017), PTSD (post-traumatic stress disorder), and Type 2 diabetes mellitus (HCC) (2025).  has a past surgical history that includes Cesarean section and masectomy (Left). Prior to Admission medications   Medication Sig Start Date End Date Taking? Authorizing Provider  leuprolide (LUPRON) 11.25 MG injection Inject 11.25 mg into the muscle every 3 (three) months.   Yes [provider]  metFORMIN  (GLUCOPHAGE -XR) 500 MG 24 hr tablet Take 1 tablet (500 mg total) by mouth daily with breakfast. 05/10/24  Yes Pender, Julie F, FNP   Semaglutide ,0.25 or 0.5MG /DOS, (OZEMPIC , 0.25 OR 0.5 MG/DOSE,) 2 MG/3ML SOPN Inject 0.25 mg into the skin once a week. 05/08/24  Yes Gareth Mliss FALCON, FNP   No Known Allergies  FAMILY HISTORY:  family history includes Arthritis in her sister; Autism in her son; Diabetes in her mother; Heart murmur in her daughter. SOCIAL HISTORY:  reports that she has never smoked. She has never used smokeless tobacco. She reports that she does not drink alcohol and does not use drugs.   Review of Systems:  Gen:  Denies  fever, sweats, chills weight loss  HEENT: Denies blurred vision, double vision, ear pain, eye pain, hearing loss, nose bleeds, sore throat Cardiac:  No dizziness, chest pain or heaviness, chest tightness,edema, No JVD Resp:   No cough, -sputum production, -shortness of breath,-wheezing, -hemoptysis,  Gi: Denies swallowing difficulty, stomach pain, nausea or vomiting, diarrhea, constipation, bowel incontinence Gu:  Denies bladder incontinence, burning urine Ext:   Denies Joint pain, stiffness or swelling Skin: Denies  skin rash, easy bruising or bleeding or hives Endoc:  Denies polyuria, polydipsia , polyphagia or weight change Psych:   Denies depression, insomnia or hallucinations  Other:  All other systems negative  VITAL SIGNS: BP 120/80   Pulse 71   Temp 98.1 F (36.7 C)   Ht 5' 2 (1.575 m)   Wt 262 lb 12.8 oz (119.2 kg)   LMP  (LMP Unknown)   SpO2 96%   Breastfeeding No   BMI  48.07 kg/m    Physical Examination:   General Appearance: No distress  EYES PERRLA, EOM intact.   NECK Supple, No JVD Pulmonary: normal breath sounds, No wheezing.  CardiovascularNormal S1,S2.  No m/r/g.   Abdomen: Benign, Soft, non-tender. Skin:   warm, no rashes, no ecchymosis  Extremities: normal, no cyanosis, clubbing. Neuro:without focal findings,  speech normal  PSYCHIATRIC: Mood, affect within normal limits.   ASSESSMENT AND PLAN  OSA I suspect that OSA is likely present due to  clinical presentation. Discussed the consequences of untreated sleep apnea. Advised not to drive drowsy for safety of patient and others. Will complete further evaluation with a home sleep study and follow up to review results.    Morbid obesity Counseled patient on diet and lifestyle modification.    MEDICATION ADJUSTMENTS/LABS AND TESTS ORDERED: Recommend Sleep Study   Patient  satisfied with Plan of action and management. All questions answered  Follow up to review HST results and treatment plan.   I spent a total of 34 minutes reviewing chart data, face-to-face evaluation with the patient, counseling and coordination of care as detailed above.    Abagail Limb, M.D.  Sleep Medicine [facility]

## 2024-05-13 NOTE — Telephone Encounter (Signed)
 Pt called states seen pulmonologist for sleep and they asked that you try to put pt on monjaro or zepbound  and also refer to nutritionist.

## 2024-05-14 ENCOUNTER — Other Ambulatory Visit: Payer: Self-pay

## 2024-05-14 ENCOUNTER — Telehealth: Payer: Self-pay | Admitting: Pharmacy Technician

## 2024-05-14 ENCOUNTER — Other Ambulatory Visit (HOSPITAL_COMMUNITY): Payer: Self-pay

## 2024-05-14 MED ORDER — OZEMPIC (0.25 OR 0.5 MG/DOSE) 2 MG/3ML ~~LOC~~ SOPN
0.2500 mL | PEN_INJECTOR | SUBCUTANEOUS | 0 refills | Status: DC
Start: 2024-05-14 — End: 2024-06-04

## 2024-05-14 NOTE — Telephone Encounter (Signed)
 Pharmacy Patient Advocate Encounter   Received notification from CoverMyMeds that prior authorization for Ozempic  (0.25 or 0.5 MG/DOSE) 2MG /3ML pen-injectors  is required/requested.   Insurance verification completed.   The patient is insured through [employer] .   Per test claim: PA required and submitted KEY/EOC/Request #: [id number] from 05/14/24 to 05/14/25. Ran test claim, Copay is $4.00. This test claim was processed through [facility]- copay amounts may vary at other pharmacies due to pharmacy/plan contracts, or as the patient moves through the different stages of their insurance plan.  Good morning, I did have a PA request for Mounjaro  this morning in [facility], BUT her insurance would have denied that PA because 2 criteria's would have to be met which is have pt tried/failed metformin  and do pt have a clinical reason that preferred can't be tried and preferred is Ozempic , Byetta,Trulicty, and Victoza.  I resubmitted Ozempic  because that was what was denied before she had a prescription for metformin  and her prescription is still showing active.

## 2024-05-14 NOTE — Addendum Note (Signed)
 Addended by: YVONE WARREN BROCKS on: 05/14/2024 02:30 PM   Modules accepted: Orders

## 2024-06-04 ENCOUNTER — Ambulatory Visit: Admitting: Nurse Practitioner

## 2024-06-04 ENCOUNTER — Other Ambulatory Visit (HOSPITAL_COMMUNITY): Payer: Self-pay

## 2024-06-04 ENCOUNTER — Encounter: Payer: Self-pay | Admitting: Nurse Practitioner

## 2024-06-04 VITALS — BP 134/82 | HR 99 | Temp 98.0°F | Resp 18 | Ht 62.0 in | Wt 261.9 lb

## 2024-06-04 DIAGNOSIS — E66813 Obesity, class 3: Secondary | ICD-10-CM

## 2024-06-04 DIAGNOSIS — L700 Acne vulgaris: Secondary | ICD-10-CM | POA: Diagnosis not present

## 2024-06-04 DIAGNOSIS — E1165 Type 2 diabetes mellitus with hyperglycemia: Secondary | ICD-10-CM

## 2024-06-04 DIAGNOSIS — R030 Elevated blood-pressure reading, without diagnosis of hypertension: Secondary | ICD-10-CM

## 2024-06-04 DIAGNOSIS — C50912 Malignant neoplasm of unspecified site of left female breast: Secondary | ICD-10-CM | POA: Diagnosis not present

## 2024-06-04 DIAGNOSIS — Z7985 Long-term (current) use of injectable non-insulin antidiabetic drugs: Secondary | ICD-10-CM

## 2024-06-04 DIAGNOSIS — Z6841 Body Mass Index (BMI) 40.0 and over, adult: Secondary | ICD-10-CM

## 2024-06-04 DIAGNOSIS — Z23 Encounter for immunization: Secondary | ICD-10-CM

## 2024-06-04 DIAGNOSIS — Z1731 Human epidermal growth factor receptor 2 positive status: Secondary | ICD-10-CM

## 2024-06-04 LAB — MICROALBUMIN / CREATININE URINE RATIO
Creatinine, Urine: 109 mg/dL (ref 20–275)
Microalb Creat Ratio: 32 mg/g{creat} — ABNORMAL HIGH (ref <30)
Microalb, Ur: 3.5 mg/dL

## 2024-06-04 MED ORDER — OZEMPIC (0.25 OR 0.5 MG/DOSE) 2 MG/3ML ~~LOC~~ SOPN: 0.5000 mL | PEN_INJECTOR | SUBCUTANEOUS | 0 refills | Status: DC

## 2024-06-04 MED ORDER — TRETINOIN 0.05 % EX CREA: TOPICAL_CREAM | Freq: Every day | CUTANEOUS | 3 refills | Status: AC

## 2024-06-04 NOTE — Progress Notes (Signed)
 BP 134/82   Pulse 99   Temp 98 F (36.7 C)   Resp 18   Ht 5' 2 (1.575 m)   Wt 261 lb 14.4 oz (118.8 kg)   LMP 05/29/2020   SpO2 99%   BMI 47.90 kg/m    Subjective:    Patient ID: Elizabeth Zimmerman, female    DOB: April 06, 1986, 38 y.o.   MRN: [id number]  HPI: Elizabeth Zimmerman is a 38 y.o. female  Chief Complaint  Patient presents with   Medical Management of Chronic Issues   Diabetes   Discussed the use of AI scribe software for clinical note transcription with the patient, who gave verbal consent to proceed.  History of Present Illness Elizabeth Zimmerman is a 38 year old female with hypertension and type 2 diabetes who presents for elevated blood pressure management.  Elevated blood pressure - Elevated blood pressure previously recorded at 140/92 mmHg, improved to 134/82 mmHg today - No home blood pressure monitoring - Attributes blood pressure elevation to stress related to her children - No chest pain, shortness of breath, or palpitations  Type 2 diabetes mellitus - Recently diagnosed with type 2 diabetes mellitus - Last hemoglobin A1c was 6.7% - Currently taking Ozempic  0.25 mg weekly - Prescribed metformin  500 mg daily but not taking it - Experiences appetite suppression, sometimes forcing herself to eat, and states 'I really don't eat a lot.'  Obesity - Weight is 260 pounds last appointment  - Body mass index (BMI) is 47.68 - Appetite suppression associated with Ozempic  Body mass index is 47.9 kg/m.  Filed Weights   06/04/24 0904  Weight: 261 lb 14.4 oz (118.8 kg)    Flowsheet Row Office Visit from 05/07/2024 in [facility]  1 56.5 inches     History of malignant neoplasm of the left breast - History of malignant neoplasm of the left breast - Currently under oncology care - Receiving Lupron injections every three months  Facial acne - Acne with little bumps and breakouts around the nose and chin - No treatments attempted since  adolescence  Medication tolerance - No issues with current medications - Tolerating medications well - Experiences nausea         05/07/2024    8:46 AM  Depression screen PHQ 2/9  Decreased Interest 3  Down, Depressed, Hopeless 3  PHQ - 2 Score 6  Altered sleeping 3  Tired, decreased energy 3  Change in appetite 3  Feeling bad or failure about yourself  3  Trouble concentrating 3  Moving slowly or fidgety/restless 0  Suicidal thoughts 0  PHQ-9 Score 21  Difficult doing work/chores Somewhat difficult    Relevant past medical, surgical, family and social history reviewed and updated as indicated. Interim medical history since our last visit reviewed. Allergies and medications reviewed and updated.  Review of Systems  Constitutional: Negative for fever or weight change.  Respiratory: Negative for cough and shortness of breath.   Cardiovascular: Negative for chest pain or palpitations.  Gastrointestinal: Negative for abdominal pain, no bowel changes.  Musculoskeletal: Negative for gait problem or joint swelling.  Skin: Negative for rash.  Neurological: Negative for dizziness or headache.  No other specific complaints in a complete review of systems (except as listed in HPI above).      Objective:      BP 134/82   Pulse 99   Temp 98 F (36.7 C)   Resp 18   Ht 5' 2 (1.575  m)   Wt 261 lb 14.4 oz (118.8 kg)   LMP 05/29/2020   SpO2 99%   BMI 47.90 kg/m    Wt Readings from Last 3 Encounters:  06/04/24 261 lb 14.4 oz (118.8 kg)  05/13/24 262 lb 12.8 oz (119.2 kg)  05/07/24 260 lb 11.2 oz (118.3 kg)    Physical Exam VITALS: BP- 134/82 GENERAL: Alert, cooperative, well developed, no acute distress. HEENT: Normocephalic, normal oropharynx, moist mucous membranes. CHEST: Clear to auscultation bilaterally, no wheezes, rhonchi, or crackles. CARDIOVASCULAR: Normal heart rate and rhythm, S1 and S2 normal without murmurs. ABDOMEN: Soft, non-tender, non-distended,  without organomegaly, normal bowel sounds. EXTREMITIES: No cyanosis or edema. NEUROLOGICAL: Cranial nerves grossly intact, moves all extremities without gross motor or sensory deficit, sensation intact bilaterally.  Diabetic Foot Exam - Simple   Simple Foot Form Diabetic Foot exam was performed with the following findings: Yes 06/04/2024  9:32 AM  Visual Inspection No deformities, no ulcerations, no other skin breakdown bilaterally: Yes Sensation Testing Intact to touch and monofilament testing bilaterally: Yes Pulse Check Posterior Tibialis and Dorsalis pulse intact bilaterally: Yes Comments     Results for orders placed or performed in visit on 05/07/24  CBC with Differential/Platelet   Collection Time: 05/07/24  9:22 AM  Result Value Ref Range   WBC 7.1 3.8 - 10.8 Thousand/uL   RBC 4.35 3.80 - 5.10 Million/uL   Hemoglobin 13.2 11.7 - 15.5 g/dL   HCT 60.4 64.9 - 54.9 %   MCV 90.8 80.0 - 100.0 fL   MCH 30.3 27.0 - 33.0 pg   MCHC 33.4 32.0 - 36.0 g/dL   RDW 87.2 88.9 - 84.9 %   Platelets 291 140 - 400 Thousand/uL   MPV 9.6 7.5 - 12.5 fL   Neutro Abs 4,246 1,500 - 7,800 cells/uL   Absolute Lymphocytes 2,251 850 - 3,900 cells/uL   Absolute Monocytes 454 200 - 950 cells/uL   Eosinophils Absolute 121 15 - 500 cells/uL   Basophils Absolute 28 0 - 200 cells/uL   Neutrophils Relative % 59.8 %   Total Lymphocyte 31.7 %   Monocytes Relative 6.4 %   Eosinophils Relative 1.7 %   Basophils Relative 0.4 %  Comprehensive metabolic panel with GFR   Collection Time: 05/07/24  9:22 AM  Result Value Ref Range   Glucose, Bld 134 (H) 65 - 99 mg/dL   BUN 7 7 - 25 mg/dL   Creat 9.31 9.49 - 9.02 mg/dL   eGFR 884 > OR = 60 fO/fpw/8.26f7   BUN/Creatinine Ratio SEE NOTE: 6 - 22 (calc)   Sodium 141 135 - 146 mmol/L   Potassium 3.8 3.5 - 5.3 mmol/L   Chloride 102 98 - 110 mmol/L   CO2 31 20 - 32 mmol/L   Calcium 9.6 8.6 - 10.2 mg/dL   Total Protein 7.4 6.1 - 8.1 g/dL   Albumin 4.4 3.6 - 5.1  g/dL   Globulin 3.0 1.9 - 3.7 g/dL (calc)   AG Ratio 1.5 1.0 - 2.5 (calc)   Total Bilirubin 0.7 0.2 - 1.2 mg/dL   Alkaline phosphatase (APISO) 104 31 - 125 U/L   AST 25 10 - 30 U/L   ALT 24 6 - 29 U/L  Lipid panel   Collection Time: 05/07/24  9:22 AM  Result Value Ref Range   Cholesterol 182 <200 mg/dL   HDL 44 (L) > OR = 50 mg/dL   Triglycerides 857 <849 mg/dL   LDL Cholesterol (Calc) 113 (H) mg/dL (  calc)   Total CHOL/HDL Ratio 4.1 <5.0 (calc)   Non-HDL Cholesterol (Calc) 138 (H) <130 mg/dL (calc)  Hemoglobin J8r   Collection Time: 05/07/24  9:22 AM  Result Value Ref Range   Hgb A1c MFr Bld 6.7 (H) <5.7 %   Mean Plasma Glucose 146 mg/dL   eAG (mmol/L) 8.1 mmol/L  Hepatitis C antibody   Collection Time: 05/07/24  9:22 AM  Result Value Ref Range   Hepatitis C Ab NON-REACTIVE NON-REACTIVE  HIV Antibody (routine testing w rflx)   Collection Time: 05/07/24  9:22 AM  Result Value Ref Range   HIV FINAL INTERPRETATION HIV NEGATIVE    HIV 1&2 Ab, 4th Generation NON-REACTIVE NON-REACTIVE  TSH   Collection Time: 05/07/24  9:22 AM  Result Value Ref Range   TSH 1.72 mIU/L          Assessment & Plan:   Problem List Items Addressed This Visit       Endocrine   Type 2 diabetes mellitus with hyperglycemia, without long-term current use of insulin (HCC)   Relevant Medications   Semaglutide ,0.25 or 0.5MG /DOS, (OZEMPIC , 0.25 OR 0.5 MG/DOSE,) 2 MG/3ML SOPN   Other Relevant Orders   HM Diabetes Foot Exam (Completed)   Urine Microalbumin w/creat. ratio     Musculoskeletal and Integument   Acne vulgaris   Relevant Medications   tretinoin (RETIN-A) 0.05 % cream     Other   HER2-positive carcinoma of left breast (HCC)   Class 3 severe obesity due to excess calories with serious comorbidity and body mass index (BMI) of 40.0 to 44.9 in adult [facility])   Relevant Medications   Semaglutide ,0.25 or 0.5MG /DOS, (OZEMPIC , 0.25 OR 0.5 MG/DOSE,) 2 MG/3ML SOPN   Other Visit Diagnoses        Immunization due    -  Primary   Relevant Orders   Flu vaccine trivalent PF, 6mos and older(Flulaval,Afluria,Fluarix,Fluzone) (Completed)     Elevated blood pressure reading            Assessment and Plan Assessment & Plan Essential hypertension Blood pressure improved from 140/92 to 134/82, with stress-related factors influencing management. - Monitor blood pressure closely.  Type 2 diabetes mellitus Recently diagnosed with an A1c of 6.7%. Currently on Ozempic  0.25 mg weekly, experiencing appetite suppression, and not taking metformin . - Continue Ozempic , increase dose to 0.5 mg after current pen is finished. - Perform foot exam. - Order urine test to check kidney function. -schedule eye exam  Obesity BMI of 47.90, weight 261 pounds, with appetite suppression from current medication regimen. - Encourage continuation of lifestyle modifications, including dietary management and regular exercise. -continue to increase physical activity, getting at least 150 min of physical activity a week.  Work on including [employer], broadcasting/film/video 2 days a week.  - continue eating at a calorie deficit 1600-1700 cal a day, eating a well balanced diet with whole foods, avoiding processed foods.   Patient is motivated to continue working on lifestyle modification.   Acne Acne on face with small bumps, particularly around the nose and chin, with no prior treatment since teenage years. - Prescribe topical cream for acne, apply three times a week initially at bedtime.  Breast cancer -currently getting lupron injections  Skin tag, on neck - Refer to Dr. Glenard for removal of skin tag.        Follow up plan: Return in about 4 months (around 10/05/2024) for follow up.

## 2024-06-05 ENCOUNTER — Ambulatory Visit: Payer: Self-pay | Admitting: Nurse Practitioner

## 2024-06-13 ENCOUNTER — Telehealth: Payer: Self-pay

## 2024-06-13 NOTE — Telephone Encounter (Signed)
 Inject.5 correct

## 2024-06-13 NOTE — Telephone Encounter (Signed)
 Copied from CRM [id number]. Topic: Clinical - Medication Question >> Jun 13, 2024 12:29 PM Antony RAMAN wrote: Reason for CRM: pt wants to know if she's suppose to take the .25 or .5 dose for Semaglutide ,0.25 or 0.5MG /DOS, (OZEMPIC , 0.25 OR 0.5 MG/DOSE,) 2 MG/3ML SOPN

## 2024-06-27 ENCOUNTER — Other Ambulatory Visit (HOSPITAL_COMMUNITY): Payer: Self-pay

## 2024-07-02 ENCOUNTER — Encounter: Attending: Nurse Practitioner | Admitting: Dietician

## 2024-07-02 ENCOUNTER — Encounter: Payer: Self-pay | Admitting: Nurse Practitioner

## 2024-07-02 ENCOUNTER — Ambulatory Visit: Admitting: Nurse Practitioner

## 2024-07-02 ENCOUNTER — Encounter: Payer: Self-pay | Admitting: Dietician

## 2024-07-02 VITALS — BP 122/88 | HR 84 | Temp 97.8°F | Ht 62.0 in | Wt 263.0 lb

## 2024-07-02 DIAGNOSIS — C50912 Malignant neoplasm of unspecified site of left female breast: Secondary | ICD-10-CM | POA: Diagnosis not present

## 2024-07-02 DIAGNOSIS — E1165 Type 2 diabetes mellitus with hyperglycemia: Secondary | ICD-10-CM | POA: Diagnosis not present

## 2024-07-02 DIAGNOSIS — Z713 Dietary counseling and surveillance: Secondary | ICD-10-CM | POA: Diagnosis not present

## 2024-07-02 DIAGNOSIS — Z7985 Long-term (current) use of injectable non-insulin antidiabetic drugs: Secondary | ICD-10-CM

## 2024-07-02 DIAGNOSIS — R030 Elevated blood-pressure reading, without diagnosis of hypertension: Secondary | ICD-10-CM

## 2024-07-02 DIAGNOSIS — R809 Proteinuria, unspecified: Secondary | ICD-10-CM | POA: Diagnosis not present

## 2024-07-02 DIAGNOSIS — L700 Acne vulgaris: Secondary | ICD-10-CM | POA: Diagnosis not present

## 2024-07-02 DIAGNOSIS — Z6841 Body Mass Index (BMI) 40.0 and over, adult: Secondary | ICD-10-CM

## 2024-07-02 DIAGNOSIS — F319 Bipolar disorder, unspecified: Secondary | ICD-10-CM

## 2024-07-02 DIAGNOSIS — I10 Essential (primary) hypertension: Secondary | ICD-10-CM

## 2024-07-02 MED ORDER — LOSARTAN POTASSIUM 25 MG PO TABS: 25.0000 mg | ORAL_TABLET | Freq: Every day | ORAL | 1 refills | Status: AC

## 2024-07-02 MED ORDER — SEMAGLUTIDE (1 MG/DOSE) 4 MG/3ML ~~LOC~~ SOPN: 1.0000 mg | PEN_INJECTOR | SUBCUTANEOUS | 1 refills | Status: DC

## 2024-07-02 NOTE — Progress Notes (Signed)
 Diabetes Self-Management Education  Visit Type: First/Initial  Appt. Start Time: 1025 Appt. End Time: 1130  07/02/2024  Elizabeth Zimmerman, identified by name and date of birth, is a 38 y.o. female with a diagnosis of Diabetes: Type 2.   ASSESSMENT Pt mother, Elveria, and son, Jacques, present for appointment Pt reports taking Ozempic  @0 .25 mg for 4 weeks, just moved to 0.5 mg this week, reports earlier satiety since starting medication and no GI side effects Pt reports history of breast cancer in 2022, treated with  L mastectomy in 2022, chemo and immunotherapy, has been in remission ever since. Pt reports being on disability for lymphedema of L arm s/p mastectomy, wants to return to work, reports current depression due to diability that has decreased motivation to manage DM. Pt reports having to care for multiple children (82,85,0,4,6) which can be highly stressful, pt wants to lose some weight to be able to keep up with them. Pt reports not being a breakfast eater, usually will have a brunch meal and dinner, may snack throughout the day on potato chips, eating away from home for 2 meals daily ([employer]ancey[employer], Sonic, McDonald's), rarely makes meals at home.   Diabetes Self-Management Education - 07/02/24 1036       Visit Information   Visit Type First/Initial      Initial Visit   Diabetes Type Type 2    Date Diagnosed 05/07/2024    Are you currently following a meal plan? No    Are you taking your medications as prescribed? Yes      Health Coping   How would you rate your overall health? Poor      Psychosocial Assessment   Patient Belief/Attitude about Diabetes Motivated to manage diabetes    What is the hardest part about your diabetes right now, causing you the most concern, or is the most worrisome to you about your diabetes?   Making healty food and beverage choices;Being active    Self-care barriers None    Self-management support Doctor's office;Family    Other persons present  Patient;Family Member;Parent    Special Needs None    Preferred Learning Style Hands on    Learning Readiness Not Ready    How often do you need to have someone help you when you read instructions, pamphlets, or other written materials from your doctor or pharmacy? 1 - Never    What is the last grade level you completed in school? 12th grade      Pre-Education Assessment   Patient understands the diabetes disease and treatment process. Needs Instruction    Patient understands incorporating nutritional management into lifestyle. Needs Instruction    Patient undertands incorporating physical activity into lifestyle. Needs Instruction    Patient understands using medications safely. Needs Instruction    Patient understands monitoring blood glucose, interpreting and using results Needs Instruction    Patient understands prevention, detection, and treatment of acute complications. Needs Instruction    Patient understands prevention, detection, and treatment of chronic complications. Needs Instruction    Patient understands how to develop strategies to address psychosocial issues. Needs Instruction    Patient understands how to develop strategies to promote health/change behavior. Needs Instruction      Complications   Last HgB A1C per patient/outside source 6.7 %   05/07/2024   How often do you check your blood sugar? 0 times/day (not testing)    Have you had a dilated eye exam in the past 12 months? No    Have  you had a dental exam in the past 12 months? No    Are you checking your feet? No      Dietary Intake   Breakfast Sonic smashburger, blue raspberry lemonade    Lunch Meatloaf, broccoli and cheese, collard greens, brownstone front cake    Dinner Mcdonald's chicken nuggets, some fries, water    Beverage(s) Water, lemonade      Activity / Exercise   Activity / Exercise Type ADL's;Light (walking / raking leaves)    How many days per week do you exercise? 5    How many minutes per day do  you exercise? 10    Total minutes per week of exercise 50      Patient Education   Previous Diabetes Education No    Disease Pathophysiology Factors that contribute to the development of diabetes;Explored patient's options for treatment of their diabetes    Healthy Eating Plate Method;Information on hints to eating out and maintain blood glucose control.    Being Active Helped patient identify appropriate exercises in relation to his/her diabetes, diabetes complications and other health issue.;Role of exercise on diabetes management, blood pressure control and cardiac health.    Medications Reviewed patients medication for diabetes, action, purpose, timing of dose and side effects.    Diabetes Stress and Support Identified and addressed patients feelings and concerns about diabetes;Role of stress on diabetes    Lifestyle and Health Coping Lifestyle issues that need to be addressed for better diabetes care      Individualized Goals (developed by patient)   Nutrition Follow meal plan discussed;General guidelines for healthy choices and portions discussed    Physical Activity 15 minutes per day    Medications take my medication as prescribed    Problem Solving Eating Pattern      Post-Education Assessment   Patient understands the diabetes disease and treatment process. Needs Review    Patient understands incorporating nutritional management into lifestyle. Needs Review    Patient undertands incorporating physical activity into lifestyle. Needs Review    Patient understands using medications safely. Needs Review    Patient understands monitoring blood glucose, interpreting and using results N/A    Patient understands prevention, detection, and treatment of acute complications. Needs Review    Patient understands prevention, detection, and treatment of chronic complications. Needs Review    Patient understands how to develop strategies to address psychosocial issues. Needs Review    Patient  understands how to develop strategies to promote health/change behavior. Needs Review      Outcomes   Expected Outcomes Demonstrated limited interest in learning.  Expect minimal changes    Future DMSE PRN    Program Status Not Completed          Individualized Plan for Diabetes Self-Management Training:   Learning Objective:  Patient will have a greater understanding of diabetes self-management. Patient education plan is to attend individual and/or group sessions per assessed needs and concerns.   Plan:  When you begin to see warning signs that your stress levels are getting high, remove yourself from the situation and go for 10-15 minute walk.  [facility] you [place] your [place] to [place], try water aerobics for a low impact form of exercise!  Choose low fat, lower starch food options. Increase your fruits and vegetables daily.  Look into [email or web address] for plenty of diabetic friendly recipes   Expected Outcomes:  Demonstrated limited interest in learning.  Expect minimal changes  Education material provided:  My Plate  If problems or questions, patient to contact team via:  Phone and Email  Future DSME appointment: PRN

## 2024-07-02 NOTE — Assessment & Plan Note (Addendum)
 Blood pressure elevated during this visit at 136/98. Blood pressure rechecked and was 122/88. Blood pressure elevated at previous visit on 06/04/2024. Checks blood pressure at home. Losartan ordered for hypertension and microalbuminuria

## 2024-07-02 NOTE — Assessment & Plan Note (Signed)
 Followed by oncology. Takes Lupron injections every 3 months.

## 2024-07-02 NOTE — Assessment & Plan Note (Signed)
 Followed by psychiatry and currently prescribed Lamictal and Zolpidem tartrate.

## 2024-07-02 NOTE — Patient Instructions (Addendum)
 When you begin to see warning signs that your stress levels are getting high, remove yourself from the situation and go for 10-15 minute walk.  [facility] to [facility] [employer] when you take your kids to [facility], try water aerobics for a low impact form of exercise!  Choose low fat, lower starch food options. Increase your fruits and vegetables daily.  Look into [email or web address] for plenty of diabetic friendly recipes

## 2024-07-02 NOTE — Assessment & Plan Note (Signed)
 Takes Ozempic  0.5 mg weekly. Denis any side effects or complications. Will titrate to 1 mg when finished with current dose. Ophthalmology referral ordered. Microalbumin/creatinine ratio slightly elevated, will continue to monitor and check at next visit. Counseled on consuming a healthy diet and exercising.

## 2024-07-02 NOTE — Assessment & Plan Note (Signed)
 Tretinoin 0.05% cream previously ordered for acne. Has not tried medication yet, but verbalizes that will try.

## 2024-07-02 NOTE — Assessment & Plan Note (Signed)
 Has gained 2 pounds since last visit. Currently takes Ozempic  0.5 mg weekly for Type 2 diabetes. Does not currently diet or exercise. Was counseled on lifestyle modifications including consuming a healthy diet with a variety of vegetables, fruits, and whole grains. Educated on getting at least 150 minutes of physical activity weekly with 2 days of weight bearing exercises.

## 2024-07-02 NOTE — Progress Notes (Signed)
 BP 122/88   Pulse 84   Temp 97.8 F (36.6 C)   Ht 5' 2 (1.575 m)   Wt 263 lb (119.3 kg)   LMP 05/29/2020   SpO2 95%   BMI 48.10 kg/m    Subjective:    Patient ID: Elizabeth Zimmerman, female    DOB: 11/26/1985, 38 y.o.   MRN: [id number]  HPI: Elizabeth Zimmerman is a 38 y.o. female  Chief Complaint  Patient presents with   Medical Management of Chronic Issues    Pt has no concerns.    Diabetes, Type 2: -Last A1c: 6.7 (05/07/2024)  -Medications: Currently on 2nd week of 0.5 mg Ozempic  weekly dose; Would like to increase to 1 mg dose after finished with current dose. -Patient is compliant with the above medications and reports no side effects.  -Does not check blood glucose at home. -Diet: Regular diet -Exercise: Does not currently exercise. -Eye exam: Referral ordered. -Foot exam: Completed 06/04/2024 -Microalbumin: Microalbumin/creatinine ratio slightly elevated (32) when checked on 06/04/2024; Will continue to monitor and recheck at next visit. Started patient on ARB -Statin: None -PNA vaccine: No -Denies symptoms of hypoglycemia, polyuria, polydipsia, numbness extremities, foot ulcers/trauma.   Hypertension: -Medications: Does not currently take any medications for hypertension. -Checks blood pressure at home, does not remember her average blood pressure readings. Endorses that diastolic blood pressure reading is usually slightly elevated. Verbalizes that hypertension may be related to stress. -Denies any SOB, CP, vision changes, LE edema or symptoms of hypotension -Blood pressure elevated during this visit at 136/98. Blood pressure rechecked and was 122/88. -Discussed with patient of starting antihypertensive medication due to elevated blood pressure and slight elevated microalbumin/creatinine ratio.   Obesity -Weight during this visit was 119.3 kg and BMI was 48.1. Patient has gained weight since previous visit as she weighed 118.8 kg on 06/04/2024. -Prescribed Ozempic   for Type 2 Diabetes. Tolerating dose well, but does not endorse any weight loss. -Counseled on consuming a healthy, well-balanced diet and exercising. Patient reports that she has an appointment with a dietician.   Filed Weights   07/02/24 0817  Weight: 263 lb (119.3 kg)   Body mass index is 48.1 kg/m.   Flowsheet Row Office Visit from 07/02/2024 in [facility] Office Visit from 05/07/2024 in [facility]  1 57 inches 56.5 inches    History of malignant neoplasm of the left breast -Followed by oncology -Medications: Lupron injections every three months   Facial acne -Acne with little bumps and breakouts around the nose and chin -Medications:Tretinoin 0.05% cream daily at bedtime. Cream prescribed and at home, but patient has not tried yet.   Bipolar disorder -Followed by psychiatry -Prescribed Lamictal and Zolpidem tartrate by psych.      05/07/2024    8:46 AM  Depression screen PHQ 2/9  Decreased Interest 3  Down, Depressed, Hopeless 3  PHQ - 2 Score 6  Altered sleeping 3  Tired, decreased energy 3  Change in appetite 3  Feeling bad or failure about yourself  3  Trouble concentrating 3  Moving slowly or fidgety/restless 0  Suicidal thoughts 0  PHQ-9 Score 21  Difficult doing work/chores Somewhat difficult    Relevant past medical, surgical, family and social history reviewed and updated as indicated. Interim medical history since our last visit reviewed. Allergies and medications reviewed and updated.  Review of Systems  Constitutional: Negative for fever or weight change.  Respiratory: Negative for cough and shortness  of breath.   Cardiovascular: Negative for chest pain or palpitations.  Gastrointestinal: Negative for abdominal pain, no bowel changes.  Musculoskeletal: Negative for gait problem or joint swelling.  Skin: Negative for rash.  Neurological: Negative for dizziness or headache.  No other specific  complaints in a complete review of systems (except as listed in HPI above).      Objective:     BP 122/88   Pulse 84   Temp 97.8 F (36.6 C)   Ht 5' 2 (1.575 m)   Wt 263 lb (119.3 kg)   LMP 05/29/2020   SpO2 95%   BMI 48.10 kg/m    Wt Readings from Last 3 Encounters:  07/02/24 263 lb (119.3 kg)  06/04/24 261 lb 14.4 oz (118.8 kg)  05/13/24 262 lb 12.8 oz (119.2 kg)    Physical Exam Constitutional:      Appearance: Normal appearance.  Cardiovascular:     Rate and Rhythm: Normal rate and regular rhythm.     Heart sounds: Normal heart sounds.  Pulmonary:     Effort: Pulmonary effort is normal.     Breath sounds: Normal breath sounds.  Skin:    General: Skin is warm and dry.  Neurological:     General: No focal deficit present.     Mental Status: She is alert and oriented to person, place, and time. Mental status is at baseline.  Psychiatric:        Mood and Affect: Mood normal.        Behavior: Behavior normal.        Thought Content: Thought content normal.        Judgment: Judgment normal.     Results for orders placed or performed in visit on 06/04/24  Urine Microalbumin w/creat. ratio   Collection Time: 06/04/24 10:31 AM  Result Value Ref Range   Creatinine, Urine 109 20 - 275 mg/dL   Microalb, Ur 3.5 mg/dL   Microalb Creat Ratio 32 (H) <30 mg/g creat          Assessment & Plan:   Problem List Items Addressed This Visit       Cardiovascular and Mediastinum   Hypertension   Blood pressure elevated during this visit at 136/98. Blood pressure rechecked and was 122/88. Blood pressure elevated at previous visit on 06/04/2024. Checks blood pressure at home. Losartan ordered for hypertension and microalbuminuria       Relevant Medications   losartan (COZAAR) 25 MG tablet     Endocrine   Type 2 diabetes mellitus with hyperglycemia, without long-term current use of insulin (HCC) - Primary   Takes Ozempic  0.5 mg weekly. Denis any side effects or  complications. Will titrate to 1 mg when finished with current dose. Ophthalmology referral ordered. Microalbumin/creatinine ratio slightly elevated, will continue to monitor and check at next visit. Counseled on consuming a healthy diet and exercising.       Relevant Medications   losartan (COZAAR) 25 MG tablet   Semaglutide , 1 MG/DOSE, 4 MG/3ML SOPN   Other Relevant Orders   Ambulatory referral to Ophthalmology     Musculoskeletal and Integument   Acne vulgaris   Tretinoin 0.05% cream previously ordered for acne. Has not tried medication yet, but verbalizes that will try.        Other   HER2-positive carcinoma of left breast (HCC)   Followed by oncology. Takes Lupron injections every 3 months.      Bipolar 1 disorder (HCC)   Followed by psychiatry and  currently prescribed Lamictal and Zolpidem tartrate.      Morbid obesity with BMI of 45.0-49.9, adult (HCC)   Has gained 2 pounds since last visit. Currently takes Ozempic  0.5 mg weekly for Type 2 diabetes. Does not currently diet or exercise. Was counseled on lifestyle modifications including consuming a healthy diet with a variety of vegetables, fruits, and whole grains. Educated on getting at least 150 minutes of physical activity weekly with 2 days of weight bearing exercises.       Relevant Medications   Semaglutide , 1 MG/DOSE, 4 MG/3ML SOPN   Microalbuminuria   Relevant Medications   losartan (COZAAR) 25 MG tablet   Other Visit Diagnoses       Elevated blood pressure reading       rechecked,  which was much improved, but due to the elevated microalbuminuria started patient on low dose losartan.            Follow up plan: Return in about 4 months (around 10/30/2024) for follow up.     I have reviewed this encounter including the documentation in this note and/or discussed this patient with the provider, Alexa Everhart SNP, I am certifying that I agree with the content of this note as supervising/preceptor nurse  practitioner.  Mliss Spray, FNP-C [facility] 07/02/2024, 9:38 AM

## 2024-07-12 ENCOUNTER — Telehealth: Payer: Self-pay

## 2024-07-12 NOTE — Telephone Encounter (Signed)
 Copied from CRM #[id number]. Topic: Clinical - Medication Question >> Jul 12, 2024  2:39 PM Dedra B wrote: Reason for CRM: Pt still has 1 mg left in her Semaglutide  0.25-0.5 mg pen and wants to know if it's okay for her to use the pen twice to finish off the pen before starting her new 1 mg pen.

## 2024-07-12 NOTE — Telephone Encounter (Signed)
 Called patient and made aware.

## 2024-08-06 ENCOUNTER — Ambulatory Visit: Admitting: Family Medicine

## 2024-08-07 ENCOUNTER — Other Ambulatory Visit (HOSPITAL_COMMUNITY)
Admission: RE | Admit: 2024-08-07 | Discharge: 2024-08-07 | Disposition: A | Source: Ambulatory Visit | Attending: Family Medicine | Admitting: Family Medicine

## 2024-08-07 ENCOUNTER — Ambulatory Visit (INDEPENDENT_AMBULATORY_CARE_PROVIDER_SITE_OTHER): Admitting: Family Medicine

## 2024-08-07 ENCOUNTER — Encounter: Payer: Self-pay | Admitting: Family Medicine

## 2024-08-07 VITALS — BP 120/78 | HR 99 | Resp 16 | Ht 62.0 in | Wt 259.9 lb

## 2024-08-07 DIAGNOSIS — L989 Disorder of the skin and subcutaneous tissue, unspecified: Secondary | ICD-10-CM

## 2024-08-07 MED ORDER — LIDOCAINE HCL (PF) 1 % IJ SOLN
2.0000 mL | Freq: Once | INTRAMUSCULAR | Status: AC
  Administered 2024-08-07: 2 mL

## 2024-08-07 NOTE — Progress Notes (Signed)
 Name: Elizabeth Zimmerman   MRN: [id number]    DOB: 1985-09-05   Date:08/07/2024       Progress Note  Subjective  Chief Complaint  Chief Complaint  Patient presents with   skin tag removal    On L side of neck    Discussed the use of AI scribe software for clinical note transcription with the patient, who gave verbal consent to proceed.  History of Present Illness Elizabeth Zimmerman is a 38 year old female with a history of breast cancer who presents with an inflamed skin tag on the left side of her neck.  She has an inflamed skin tag on the left side of her neck, causing intermittent discomfort, which worsens with contact from clothing or during sleep. The skin tag has increased in size and become dry over time.  The discomfort began during her chemotherapy treatment for breast cancer, completed in 2023. Since then, she has avoided wearing necklaces due to the location and sensitivity of the skin tag.  Her past medical history includes breast cancer diagnosed during pregnancy, with treatment delayed until she was nearly eight months pregnant, necessitating an early delivery.    Patient Active Problem List   Diagnosis Date Noted   Hypertension 07/02/2024   Microalbuminuria 07/02/2024   Acne vulgaris 06/04/2024   Type 2 diabetes mellitus with hyperglycemia, without long-term current use of insulin (HCC) 05/08/2024   HER2-positive carcinoma of left breast (HCC) 05/07/2024   Bipolar 1 disorder (HCC) 05/07/2024   Morbid obesity with BMI of 45.0-49.9, adult (HCC) 05/07/2024   Prediabetes 07/20/2023   Lymphedema of arm 11/08/2021   Morbid obesity with BMI of 40.0-44.9, adult (HCC) 05/21/2018   Labor and delivery, indication for care 02/28/2015    Social History   Tobacco Use   Smoking status: Never   Smokeless tobacco: Never  Substance Use Topics   Alcohol use: No     Current Outpatient Medications:    leuprolide (LUPRON) 11.25 MG injection, Inject 11.25 mg into the muscle every  3 (three) months., Disp: , Rfl:    losartan  (COZAAR ) 25 MG tablet, Take 1 tablet (25 mg total) by mouth daily., Disp: 90 tablet, Rfl: 1   Semaglutide , 1 MG/DOSE, 4 MG/3ML SOPN, Inject 1 mg as directed once a week., Disp: 3 mL, Rfl: 1   tretinoin  (RETIN-A ) 0.05 % cream, Apply topically at bedtime., Disp: 45 g, Rfl: 3  Current Facility-Administered Medications:    lidocaine (PF) (XYLOCAINE) 1 % injection 2 mL, 2 mL, Other, Once,   No Known Allergies  ROS  Ten systems reviewed and is negative except as mentioned in HPI    Objective  Vitals:   08/07/24 1000  BP: 120/78  Pulse: 99  Resp: 16  SpO2: 99%  Weight: 259 lb 14.4 oz (117.9 kg)  Height: 5' 2 (1.575 m)    Body mass index is 47.54 kg/m.    Physical Exam CONSTITUTIONAL: Patient appears well-developed and well-nourished. No distress. HEENT: Head atraumatic, normocephalic, neck supple. CARDIOVASCULAR: Normal rate, regular rhythm and normal heart sounds. No murmur heard. No BLE edema. PULMONARY: Effort normal and breath sounds normal. No respiratory distress. MUSCULOSKELETAL: Normal gait. Without gross motor or sensory deficit. PSYCHIATRIC: Patient has a normal mood and affect. Behavior is normal. Judgment and thought content normal. SKIN: Inflamed skin tag on the left side of the neck.  Recent Results (from the past 2160 hours)  Urine Microalbumin w/creat. ratio     Status: Abnormal   Collection Time:  06/04/24 10:31 AM  Result Value Ref Range   Creatinine, Urine 109 20 - 275 mg/dL   Microalb, Ur 3.5 mg/dL    Comment: Reference Range Not established    Microalb Creat Ratio 32 (H) <30 mg/g creat    Comment: . The ADA defines abnormalities in albumin excretion as follows: SABRA Albuminuria Category        Result (mg/g creatinine) . Normal to Mildly increased   <30 Moderately increased         30-299  Severely increased           > OR = 300 . The ADA recommends that at least two of three specimens collected within  a 3-6 month period be abnormal before considering a patient to be within a diagnostic category.       Assessment & Plan  Skin lesion left side  neck Likely : Inflamed skin tag on the left neck, causing pain and discomfort. Differential includes benign inflamed skin tag. - Removed inflamed skin tag. - Sent tissue to pathology. - Applied pressure dressing. - Instructed to keep Band-Aid for 24+ hours. - Advised return for cauterization if bleeding    Consent signed: YES  Procedure: Skin Mass Removal Location: left side neck  Equipment used: derma-blade, high temperature cautery, sterile scalpel, tweezers, curved scissors Anesthesia: 1% Lidocaine w/o Epinephrine  Cleaned and prepped: Betadine  After consent signed, are of skin prepped with betadine. Lidocaine w/o epinephrine injected into skin underneath skin mass. After properly numbed sterile equipment used to remove tag.  Specimen sent for pathology analysis. Instructed on proper care to allow for proper healing. F/U for nursing visit if needed.

## 2024-08-12 ENCOUNTER — Ambulatory Visit: Payer: Self-pay | Admitting: Family Medicine

## 2024-08-12 LAB — SURGICAL PATHOLOGY

## 2024-08-30 ENCOUNTER — Ambulatory Visit

## 2024-08-30 DIAGNOSIS — G4733 Obstructive sleep apnea (adult) (pediatric): Secondary | ICD-10-CM

## 2024-10-01 ENCOUNTER — Other Ambulatory Visit: Payer: Self-pay | Admitting: Nurse Practitioner

## 2024-10-01 DIAGNOSIS — E1165 Type 2 diabetes mellitus with hyperglycemia: Secondary | ICD-10-CM

## 2024-10-01 MED ORDER — SEMAGLUTIDE (1 MG/DOSE) 4 MG/3ML ~~LOC~~ SOPN: 1.0000 mg | PEN_INJECTOR | SUBCUTANEOUS | 1 refills | Status: AC

## 2024-10-01 NOTE — Telephone Encounter (Signed)
 Copied from CRM #[id number]. Topic: Clinical - Medication Question >> Oct 01, 2024  3:44 PM Nessti S wrote: Reason for CRM: pt called because she ran out of Semaglutide , 1 MG/DOSE, 4 MG/3ML SOPN and she wanted to know if she is supposed to still take this med. She would like a call back soon as possible

## 2024-10-30 ENCOUNTER — Ambulatory Visit: Admitting: Nurse Practitioner
